# Patient Record
Sex: Male | Born: 1965
Health system: Southern US, Community
[De-identification: ages and names within clinical notes are randomized; demographics above are authoritative.]

## PROBLEM LIST (undated history)

## (undated) DIAGNOSIS — E119 Type 2 diabetes mellitus without complications: Secondary | ICD-10-CM

## (undated) DIAGNOSIS — L409 Psoriasis, unspecified: Secondary | ICD-10-CM

## (undated) DIAGNOSIS — J301 Allergic rhinitis due to pollen: Secondary | ICD-10-CM

## (undated) DIAGNOSIS — Z8619 Personal history of other infectious and parasitic diseases: Secondary | ICD-10-CM

## (undated) DIAGNOSIS — G4730 Sleep apnea, unspecified: Secondary | ICD-10-CM

## (undated) DIAGNOSIS — G510 Bell's palsy: Secondary | ICD-10-CM

## (undated) HISTORY — DX: Personal history of other infectious and parasitic diseases: Z86.19

## (undated) HISTORY — DX: Psoriasis, unspecified: L40.9

## (undated) HISTORY — PX: APPENDECTOMY: SHX54

## (undated) HISTORY — DX: Type 2 diabetes mellitus without complications: E11.9

## (undated) HISTORY — PX: ULNAR NERVE REPAIR: SHX2594

## (undated) HISTORY — DX: Bell's palsy: G51.0

## (undated) HISTORY — DX: Allergic rhinitis due to pollen: J30.1

## (undated) HISTORY — PX: SHOULDER SURGERY: SHX246

## (undated) HISTORY — DX: Sleep apnea, unspecified: G47.30

---

## 2008-02-12 DIAGNOSIS — G4730 Sleep apnea, unspecified: Secondary | ICD-10-CM

## 2008-02-12 HISTORY — DX: Sleep apnea, unspecified: G47.30

## 2008-09-27 ENCOUNTER — Ambulatory Visit: Payer: Self-pay | Admitting: Family Medicine

## 2009-02-17 ENCOUNTER — Other Ambulatory Visit: Payer: Self-pay | Admitting: Family Medicine

## 2010-03-26 ENCOUNTER — Ambulatory Visit: Payer: Self-pay | Admitting: Family Medicine

## 2010-04-02 ENCOUNTER — Ambulatory Visit: Payer: Self-pay | Admitting: Family Medicine

## 2013-03-08 ENCOUNTER — Ambulatory Visit (INDEPENDENT_AMBULATORY_CARE_PROVIDER_SITE_OTHER): Payer: BC Managed Care – PPO | Admitting: Adult Health

## 2013-03-08 ENCOUNTER — Encounter: Payer: Self-pay | Admitting: Adult Health

## 2013-03-08 VITALS — BP 110/70 | HR 76 | Temp 98.4°F | Resp 14 | Ht 71.0 in | Wt 241.8 lb

## 2013-03-08 DIAGNOSIS — Z23 Encounter for immunization: Secondary | ICD-10-CM

## 2013-03-08 DIAGNOSIS — R519 Headache, unspecified: Secondary | ICD-10-CM | POA: Insufficient documentation

## 2013-03-08 DIAGNOSIS — Z0000 Encounter for general adult medical examination without abnormal findings: Secondary | ICD-10-CM

## 2013-03-08 DIAGNOSIS — Z79899 Other long term (current) drug therapy: Secondary | ICD-10-CM

## 2013-03-08 DIAGNOSIS — Z125 Encounter for screening for malignant neoplasm of prostate: Secondary | ICD-10-CM

## 2013-03-08 DIAGNOSIS — E119 Type 2 diabetes mellitus without complications: Secondary | ICD-10-CM

## 2013-03-08 DIAGNOSIS — R51 Headache: Secondary | ICD-10-CM

## 2013-03-08 MED ORDER — SITAGLIPTIN PHOS-METFORMIN HCL 50-1000 MG PO TABS: 1.0000 | ORAL_TABLET | Freq: Two times a day (BID) | ORAL | Status: DC

## 2013-03-08 NOTE — Progress Notes (Signed)
Pre-visit discussion using our clinic review tool. No additional management support is needed unless otherwise documented below in the visit note.  

## 2013-03-08 NOTE — Patient Instructions (Signed)
   [facility] you for choosing [place].  Please have your labs drawn at your earliest convenience. Nothing to eat or drink after midnight except for water.  The results will be available through MyChart for your convenience. Please remember to activate this. The activation code is located at the end of this form.  If your headaches become severe or debilitating please call the office. I will send you for a scan.  You can try Excedrin Migraine or Tension Headache and alternate with the Ibuprofen.  You received your flu shot in clinic today.

## 2013-03-08 NOTE — Assessment & Plan Note (Signed)
Normal exam. Ordered labs: cbc w/diff, tsh, lipids, hepatic panel, bmet. Refill meds as needed.

## 2013-03-08 NOTE — Assessment & Plan Note (Signed)
Increasing frequency for about 6 months. Reports position change at work with added stress. Usually headaches resolve on their own or with ibuprofen. No other symptoms associated with his HA. Recommend relaxation, exercise. May try over-the-counter Excedrin tension headache. If symptoms worsen or fail to improve consider head CT.

## 2013-03-08 NOTE — Progress Notes (Signed)
Subjective:    Patient ID: Kenneth [name], male    DOB: June 04, 1965, 48 y.o.   MRN: [id number]  HPI  Pt is a pleasant 48 y/o male who presents to clinic to establish care.  He was previously [facility] by [facility]. Has been experience headaches for ~ 6 months in the center of the top of his head. They usually last 4-5 hours. He describes the pain as sharp. Denies photophobia, blurred vision, slurred speech, dizziness or any other symptom associated with HA. The headaches usually occur mid morning. Does not wake up with HA. Takes 400 - 600 mg of advil and may not have relief. Sometimes he does not take anything and the HA goes away on its own. Reports increasing stress and tension. 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   Past Medical History  Diagnosis Date  . History of chicken pox   . History of shingles   . Hay fever   . Diabetes   . Psoriasis   . Bell palsy   . Sleep apnea 2010    CPAP     Past Surgical History  Procedure Laterality Date  . Appendectomy    . Shoulder surgery Right   . Ulnar nerve repair Left      Family History  Problem Relation Age of Onset  . Cancer Mother [id number]    Pancreatic Cancer  . Hypertension Mother   . Arthritis Father   . Cancer Maternal Grandmother     Lung Cancer - Smoker  . Cancer Maternal Grandfather     Colon Cancer  . Heart disease Paternal Grandmother     Heart Failure  . Heart disease Paternal Grandfather     Heart Failure  . Diabetes Brother   . Diabetes Brother      History   Social History  . Marital Status: Married    Spouse Name: [name]    Number of Children: 1  . Years of Education: 418   Occupational History  . Aquatic Instructor     [employer]   Social History Main Topics  . Smoking status: Never Smoker   . Smokeless tobacco: Never Used  . Alcohol Use: Yes     Comment: occassionally  . Drug Use: No  . Sexual Activity: Not on file   Other  Topics Concern  . Not on file   Social History Narrative   Kenneth [name] grew up in [place], [place]. He attended [employer] and obtained his Bachelors degree in Health & Advertising account plannerHuman Performance. He then attended [employer] and obtained his Masters in [employer]. He is [place] living in the [place] area with his wife and daughter. He enjoys golf on his spare time as well as spending time with his family.      Caffeine - 6 cans of unsweetened colas or tea   No tobacco use   Exercise - none   Diet - Meats and Potatoes, some fruit, no vegetables    Health Maintenance:  Tdap - 2010  Flu shot - today  Pneumonia vaccine - 2010  Labs - needs today  Depression Screen - some mild symptoms. Well controlled on zoloft  Tobacco Use - None  Dental Exams - twice yearly  Vision Exam - needs appt. [facility]  Exercise - None  Diet - Meat and Potatoes. No vegetables, some fruit   Review of Systems  Constitutional: Negative.   HENT: Negative.  Eyes: Negative.   Respiratory: Negative.   Cardiovascular: Negative.   Gastrointestinal: Negative.   Endocrine: Negative.   Genitourinary: Negative.   Musculoskeletal: Negative.   Skin: Negative.   Allergic/Immunologic: Negative.   Neurological: Negative.   Hematological: Negative.   Psychiatric/Behavioral: Negative.        Objective:   Physical Exam  Constitutional: He is oriented to person, place, and time. He appears well-developed and well-nourished. No distress.  HENT:  Head: Normocephalic and atraumatic.  Right Ear: External ear normal.  Left Ear: External ear normal.  Nose: Nose normal.  Mouth/Throat: Oropharynx is clear and moist.  Eyes: Conjunctivae and EOM are normal. Pupils are equal, round, and reactive to light.  Neck: Normal range of motion. Neck supple. No tracheal deviation present. No thyromegaly present.  Cardiovascular: Normal rate, regular rhythm, normal heart sounds and intact distal pulses.  Exam  reveals no gallop and no friction rub.   No murmur heard. Pulmonary/Chest: Effort normal and breath sounds normal. No respiratory distress. He has no wheezes. He has no rales.  Abdominal: Soft. Bowel sounds are normal. He exhibits no distension and no mass. There is no tenderness. There is no rebound and no guarding.  Musculoskeletal: Normal range of motion. He exhibits no edema and no tenderness.  Lymphadenopathy:    He has no cervical adenopathy.  Neurological: He is alert and oriented to person, place, and time. He has normal reflexes. No cranial nerve deficit. Coordination normal.  Skin: Skin is warm and dry.  Psychiatric: He has a normal mood and affect. His behavior is normal. Judgment and thought content normal.    BP 110/70  Pulse 76  Temp(Src) 98.4 F (36.9 C) (Oral)  Resp 14  Ht 5\' 11"  (1.803 m)  Wt 241 lb 12 oz (109.657 kg)  BMI 33.73 kg/m2  SpO2 98%       Assessment & Plan:

## 2013-03-11 ENCOUNTER — Other Ambulatory Visit: Payer: Self-pay | Admitting: Adult Health

## 2013-03-11 ENCOUNTER — Encounter: Payer: Self-pay | Admitting: Adult Health

## 2013-03-11 ENCOUNTER — Other Ambulatory Visit (INDEPENDENT_AMBULATORY_CARE_PROVIDER_SITE_OTHER): Payer: BC Managed Care – PPO

## 2013-03-11 ENCOUNTER — Telehealth: Payer: Self-pay

## 2013-03-11 DIAGNOSIS — Z79899 Other long term (current) drug therapy: Secondary | ICD-10-CM

## 2013-03-11 DIAGNOSIS — Z125 Encounter for screening for malignant neoplasm of prostate: Secondary | ICD-10-CM

## 2013-03-11 DIAGNOSIS — E119 Type 2 diabetes mellitus without complications: Secondary | ICD-10-CM

## 2013-03-11 DIAGNOSIS — Z0000 Encounter for general adult medical examination without abnormal findings: Secondary | ICD-10-CM

## 2013-03-11 LAB — HEMOGLOBIN A1C: Hgb A1c MFr Bld: 8.6 % — ABNORMAL HIGH (ref 4.6–6.5)

## 2013-03-11 LAB — CBC WITH DIFFERENTIAL/PLATELET
Basophils Absolute: 0 10*3/uL (ref 0.0–0.1)
Basophils Relative: 0.4 % (ref 0.0–3.0)
Eosinophils Absolute: 0.1 10*3/uL (ref 0.0–0.7)
Eosinophils Relative: 1.6 % (ref 0.0–5.0)
HCT: 46.8 % (ref 39.0–52.0)
Hemoglobin: 15.5 g/dL (ref 13.0–17.0)
Lymphocytes Relative: 17.3 % (ref 12.0–46.0)
Lymphs Abs: 1.5 10*3/uL (ref 0.7–4.0)
MCHC: 33.1 g/dL (ref 30.0–36.0)
MCV: 89.5 fl (ref 78.0–100.0)
Monocytes Absolute: 0.6 10*3/uL (ref 0.1–1.0)
Monocytes Relative: 6.9 % (ref 3.0–12.0)
Neutro Abs: 6.4 10*3/uL (ref 1.4–7.7)
Neutrophils Relative %: 73.8 % (ref 43.0–77.0)
Platelets: 216 10*3/uL (ref 150.0–400.0)
RBC: 5.23 Mil/uL (ref 4.22–5.81)
RDW: 13.6 % (ref 11.5–14.6)
WBC: 8.7 10*3/uL (ref 4.5–10.5)

## 2013-03-11 LAB — LIPID PANEL
Cholesterol: 144 mg/dL (ref 0–200)
HDL: 43.1 mg/dL (ref >39.00)
LDL Cholesterol: 75 mg/dL (ref 0–99)
Total CHOL/HDL Ratio: 3
Triglycerides: 129 mg/dL (ref 0.0–149.0)
VLDL: 25.8 mg/dL (ref 0.0–40.0)

## 2013-03-11 LAB — HEPATIC FUNCTION PANEL
ALT: 41 U/L (ref 0–53)
AST: 25 U/L (ref 0–37)
Albumin: 4.1 g/dL (ref 3.5–5.2)
Alkaline Phosphatase: 69 U/L (ref 39–117)
Bilirubin, Direct: 0.1 mg/dL (ref 0.0–0.3)
Total Bilirubin: 0.9 mg/dL (ref 0.3–1.2)
Total Protein: 7.1 g/dL (ref 6.0–8.3)

## 2013-03-11 LAB — BASIC METABOLIC PANEL
BUN: 17 mg/dL (ref 6–23)
CO2: 24 mEq/L (ref 19–32)
Calcium: 9.5 mg/dL (ref 8.4–10.5)
Chloride: 108 mEq/L (ref 96–112)
Creatinine, Ser: 1 mg/dL (ref 0.4–1.5)
GFR: 85.47 mL/min (ref >60.00)
Glucose, Bld: 156 mg/dL — ABNORMAL HIGH (ref 70–99)
Potassium: 4.2 mEq/L (ref 3.5–5.1)
Sodium: 138 mEq/L (ref 135–145)

## 2013-03-11 LAB — TSH: TSH: 1.16 u[IU]/mL (ref 0.35–5.50)

## 2013-03-11 LAB — PSA: PSA: 0.93 ng/mL (ref 0.10–4.00)

## 2013-03-11 MED ORDER — INSULIN DETEMIR 100 UNIT/ML FLEXPEN: 10.0000 [IU] | PEN_INJECTOR | Freq: Every day | SUBCUTANEOUS | Status: DC

## 2013-03-11 NOTE — Telephone Encounter (Signed)
Relevant patient education assigned to patient using Emmi. ° °

## 2013-03-18 ENCOUNTER — Encounter: Payer: Self-pay | Admitting: Adult Health

## 2013-03-31 ENCOUNTER — Encounter: Payer: Self-pay | Admitting: Adult Health

## 2013-03-31 MED ORDER — CANAGLIFLOZIN 300 MG PO TABS: 300.0000 mg | ORAL_TABLET | Freq: Every day | ORAL | Status: DC

## 2013-06-14 ENCOUNTER — Emergency Department: Payer: Self-pay | Admitting: Emergency Medicine

## 2013-06-22 ENCOUNTER — Encounter: Payer: Self-pay | Admitting: Adult Health

## 2013-06-22 ENCOUNTER — Ambulatory Visit: Payer: BC Managed Care – PPO | Admitting: Adult Health

## 2013-06-22 ENCOUNTER — Ambulatory Visit (INDEPENDENT_AMBULATORY_CARE_PROVIDER_SITE_OTHER): Payer: Worker's Compensation | Admitting: Adult Health

## 2013-06-22 VITALS — BP 124/84 | HR 76 | Temp 98.1°F | Resp 14 | Wt 241.0 lb

## 2013-06-22 DIAGNOSIS — Z4802 Encounter for removal of sutures: Secondary | ICD-10-CM

## 2013-06-22 NOTE — Progress Notes (Signed)
Pre visit review using our clinic review tool, if applicable. No additional management support is needed unless otherwise documented below in the visit note. 
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
## 2013-11-11 ENCOUNTER — Other Ambulatory Visit: Payer: Self-pay | Admitting: *Deleted

## 2013-11-11 MED ORDER — SITAGLIPTIN PHOS-METFORMIN HCL 50-1000 MG PO TABS: 1.0000 | ORAL_TABLET | Freq: Two times a day (BID) | ORAL | Status: DC

## 2013-11-11 MED ORDER — CANAGLIFLOZIN 300 MG PO TABS: 300.0000 mg | ORAL_TABLET | Freq: Every day | ORAL | Status: DC

## 2013-12-22 ENCOUNTER — Ambulatory Visit (INDEPENDENT_AMBULATORY_CARE_PROVIDER_SITE_OTHER): Payer: BC Managed Care – PPO | Admitting: Internal Medicine

## 2013-12-22 ENCOUNTER — Encounter: Payer: Self-pay | Admitting: Internal Medicine

## 2013-12-22 ENCOUNTER — Ambulatory Visit (INDEPENDENT_AMBULATORY_CARE_PROVIDER_SITE_OTHER): Payer: BC Managed Care – PPO | Admitting: *Deleted

## 2013-12-22 VITALS — BP 130/94 | HR 77 | Temp 98.4°F | Resp 16 | Ht 71.0 in | Wt 242.2 lb

## 2013-12-22 DIAGNOSIS — E1165 Type 2 diabetes mellitus with hyperglycemia: Secondary | ICD-10-CM

## 2013-12-22 DIAGNOSIS — E669 Obesity, unspecified: Secondary | ICD-10-CM

## 2013-12-22 DIAGNOSIS — I10 Essential (primary) hypertension: Secondary | ICD-10-CM

## 2013-12-22 DIAGNOSIS — R519 Headache, unspecified: Secondary | ICD-10-CM

## 2013-12-22 DIAGNOSIS — Z23 Encounter for immunization: Secondary | ICD-10-CM

## 2013-12-22 DIAGNOSIS — G8929 Other chronic pain: Secondary | ICD-10-CM

## 2013-12-22 DIAGNOSIS — IMO0001 Reserved for inherently not codable concepts without codable children: Secondary | ICD-10-CM

## 2013-12-22 DIAGNOSIS — R51 Headache: Secondary | ICD-10-CM

## 2013-12-22 DIAGNOSIS — G4733 Obstructive sleep apnea (adult) (pediatric): Secondary | ICD-10-CM

## 2013-12-22 DIAGNOSIS — G4452 New daily persistent headache (NDPH): Secondary | ICD-10-CM

## 2013-12-22 DIAGNOSIS — Z9989 Dependence on other enabling machines and devices: Secondary | ICD-10-CM

## 2013-12-22 DIAGNOSIS — IMO0002 Reserved for concepts with insufficient information to code with codable children: Secondary | ICD-10-CM

## 2013-12-22 LAB — LIPID PANEL
Cholesterol: 163 mg/dL (ref 0–200)
HDL: 49.5 mg/dL (ref >39.00)
LDL Cholesterol: 99 mg/dL (ref 0–99)
NonHDL: 113.5
Total CHOL/HDL Ratio: 3
Triglycerides: 74 mg/dL (ref 0.0–149.0)
VLDL: 14.8 mg/dL (ref 0.0–40.0)

## 2013-12-22 LAB — COMPREHENSIVE METABOLIC PANEL
ALT: 60 U/L — ABNORMAL HIGH (ref 0–53)
AST: 34 U/L (ref 0–37)
Albumin: 3.9 g/dL (ref 3.5–5.2)
Alkaline Phosphatase: 69 U/L (ref 39–117)
BUN: 11 mg/dL (ref 6–23)
CO2: 20 mEq/L (ref 19–32)
Calcium: 9.8 mg/dL (ref 8.4–10.5)
Chloride: 110 mEq/L (ref 96–112)
Creatinine, Ser: 1 mg/dL (ref 0.4–1.5)
GFR: 81.39 mL/min (ref >60.00)
Glucose, Bld: 219 mg/dL — ABNORMAL HIGH (ref 70–99)
Potassium: 4.7 mEq/L (ref 3.5–5.1)
Sodium: 144 mEq/L (ref 135–145)
Total Bilirubin: 0.7 mg/dL (ref 0.2–1.2)
Total Protein: 7.4 g/dL (ref 6.0–8.3)

## 2013-12-22 LAB — MICROALBUMIN / CREATININE URINE RATIO
Creatinine,U: 116.8 mg/dL
Microalb Creat Ratio: 5.2 mg/g (ref 0.0–30.0)
Microalb, Ur: 6.1 mg/dL — ABNORMAL HIGH (ref 0.0–1.9)

## 2013-12-22 LAB — HEMOGLOBIN A1C: Hgb A1c MFr Bld: 9 % — ABNORMAL HIGH (ref 4.6–6.5)

## 2013-12-22 LAB — HM DIABETES FOOT EXAM: HM Diabetic Foot Exam: NORMAL

## 2013-12-22 MED ORDER — ALPRAZOLAM 0.25 MG PO TABS: 0.2500 mg | ORAL_TABLET | Freq: Two times a day (BID) | ORAL | Status: DC | PRN

## 2013-12-22 MED ORDER — SERTRALINE HCL 100 MG PO TABS: 100.0000 mg | ORAL_TABLET | Freq: Every day | ORAL | Status: DC

## 2013-12-22 MED ORDER — CANAGLIFLOZIN 300 MG PO TABS: 300.0000 mg | ORAL_TABLET | Freq: Every day | ORAL | Status: DC

## 2013-12-22 MED ORDER — LISINOPRIL 10 MG PO TABS: 10.0000 mg | ORAL_TABLET | Freq: Every day | ORAL | Status: DC

## 2013-12-22 MED ORDER — GLUCOSE BLOOD VI STRP
ORAL_STRIP | Status: DC
Start: 2013-12-22 — End: 2016-03-15

## 2013-12-22 MED ORDER — SITAGLIPTIN PHOS-METFORMIN HCL 50-1000 MG PO TABS: 1.0000 | ORAL_TABLET | Freq: Two times a day (BID) | ORAL | Status: DC

## 2013-12-22 NOTE — Progress Notes (Signed)
Patient ID: Kenneth [name], male   DOB: [birth year], 48 y.o.   MRN: [id number]  Patient Active Problem List   Diagnosis Date Noted  . Diabetes mellitus type 2, uncontrolled, without complications 12/24/2013  . Obesity 12/24/2013  . OSA on CPAP 12/24/2013  . Essential hypertension 12/24/2013  . Routine general medical examination at a health care facility 03/08/2013  . Headache 03/08/2013    Subjective:  CC:   Chief Complaint  Patient presents with  . Follow-up    Type II diaetes, Patient forgets to take levemir for 2 weeks at a time and then will remember for two weeks.  . Diabetes    HPI:   Kenneth [name] is a 48 y.o. male who presents for   1) Follow upon uncontrolled DM type 2.  He was diagnosed 10 years ago,  Last time it was controlled was 2 years ago with janumet.  Not exercising,  Not following a low glycemic index diet, not checking his blood sugars but taking his medications as directed.     2) 1.5 yrs of daily headache ,  Top of head ,  Takes advil twice weekly, sometimes stabbing, that's when he takes the advil . Marland Kitchen.  n progression Wakes up with it.    Prior evaluation has included Sleep study ,  And he has been using CPAP for  the past 6 years. wearing it a minimum of 6 hours per night ,  Has been feeling less rested  For the past several months .  Rates the headache as 2/10 on any given day,  5 to 6/10 on the worst day   History of cervical DDD ,  prior myelogram done when living in [place] in 1987 showed a C5 problem  With right arm radiculopathy which was  confirmed with EMG nerve conduction studies  No history of concussion .  Non smoker    Past Medical History  Diagnosis Date  . History of chicken pox   . History of shingles   . Hay fever   . Diabetes   . Psoriasis   . Bell palsy   . Sleep apnea 2010    CPAP    Past Surgical History  Procedure Laterality Date  . Appendectomy    . Shoulder surgery Right   . Ulnar nerve repair Left        The  following portions of the patient's history were reviewed and updated as appropriate: Allergies, current medications, and problem list.    Review of Systems:   Patient denies headache, fevers, malaise, unintentional weight loss, skin rash, eye pain, sinus congestion and sinus pain, sore throat, dysphagia,  hemoptysis , cough, dyspnea, wheezing, chest pain, palpitations, orthopnea, edema, abdominal pain, nausea, melena, diarrhea, constipation, flank pain, dysuria, hematuria, urinary  Frequency, nocturia, numbness, tingling, seizures,  Focal weakness, Loss of consciousness,  Tremor, insomnia, depression, anxiety, and suicidal ideation.     History   Social History  . Marital Status: Married    Spouse Name: [name]    Number of Children: 1  . Years of Education: 2318   Occupational History  . Aquatic Instructor     [employer]   Social History Main Topics  . Smoking status: Never Smoker   . Smokeless tobacco: Never Used  . Alcohol Use: Yes     Comment: occassionally  . Drug Use: No  . Sexual Activity: Not on file   Other Topics Concern  . Not on file   Social History Narrative  Con grew up in [place], [place]. He attended [employer] and obtained his Bachelors degree in Health & Advertising account planner. He then attended [employer] and obtained his Masters in [employer]. He is currently living in the [place] area with his wife and daughter. He enjoys golf on his spare time as well as spending time with his family.      Caffeine - 6 cans of unsweetened colas or tea   No tobacco use   Exercise - none   Diet - Meats and Potatoes, some fruit, no vegetables    Objective:  Filed Vitals:   12/22/13 0851  BP: 130/94  Pulse: 77  Temp: 98.4 F (36.9 C)  Resp: 16     General appearance: alert, cooperative and appears stated age Ears: normal TM's and external ear canals both ears Throat: lips, mucosa, and tongue normal; teeth and gums normal Neck: no adenopathy, no carotid  bruit, supple, symmetrical, trachea midline and thyroid not enlarged, symmetric, no tenderness/mass/nodules Back: symmetric, no curvature. ROM normal. No CVA tenderness. Lungs: clear to auscultation bilaterally Heart: regular rate and rhythm, S1, S2 normal, no murmur, click, rub or gallop Abdomen: soft, non-tender; bowel sounds normal; no masses,  no organomegaly Pulses: 2+ and symmetric Skin: Skin color, texture, turgor normal. No rashes or lesions Lymph nodes: Cervical, supraclavicular, and axillary nodes normal. Neuro: CNs 2-12 intact. DTRs 2+/4 in biceps, brachioradialis, patellars and achilles. Muscle strength 5/5 in upper and lower exremities. Fine resting tremor bilaterally both hands cerebellar function normal. Romberg negative.  No pronator drift.   Gait normal.    Assessment and Plan:  Diabetes mellitus type 2, uncontrolled, without complications  diabetes is under poor control on current regimen,  So we are  Changing levemir to NPH starting at 15 units in the evening. Will adjust 3 x 3..foot exam is normal and eye exam reminder given with referral made  Headache He has a chronic daily headache for the past 1.5 years with treated OSA,  Cervical DDD and no habitual use of NSAIDs.  MRI of brain ordered to rule out mass  Obesity I have addressed  BMI and recommended wt loss of 10% of body weigh over the next 6 months using a low glycemic index diet and regular exercise a minimum of 5 days per week.    OSA on CPAP Diagnosed by sleep study. he is wearing her CPAP every night a minimum of 6 hours per night and notes improved daytime wakefulness and decreased fatigue   Essential hypertension Elevated today.  Reviewed list of meds, patient is not taking OTC meds that could be causing,. It.  Have asked patient to recheck bp at home a minimum of 5 times over the next 4 weeks and call readings to office for adjustment of medications.    A total of 40 minutes was spent with patient more  than half of which was spent in counseling patient on the above mentioned issues , reviewing and explaining recent labs and imaging studies done, and coordination of care.  Updated Medication List Outpatient Encounter Prescriptions as of 12/22/2013  Medication Sig  . canagliflozin (INVOKANA) 300 MG TABS tablet Take 300 mg by mouth daily.  Marland Kitchen lisinopril (PRINIVIL,ZESTRIL) 10 MG tablet Take 1 tablet (10 mg total) by mouth daily.  . sertraline (ZOLOFT) 100 MG tablet Take 1 tablet (100 mg total) by mouth daily.  . simvastatin (ZOCOR) 20 MG tablet Take 20 mg by mouth daily.  . sitaGLIPtin-metformin (JANUMET) 50-1000 MG per tablet  Take 1 tablet by mouth 2 (two) times daily with a meal.  . [DISCONTINUED] Canagliflozin (INVOKANA) 300 MG TABS Take 1 tablet (300 mg total) by mouth daily.  . [DISCONTINUED] Insulin Detemir (LEVEMIR) 100 UNIT/ML Pen Inject 10 Units into the skin daily at 10 pm.  . [DISCONTINUED] lisinopril (PRINIVIL,ZESTRIL) 10 MG tablet Take 10 mg by mouth daily.  . [DISCONTINUED] sertraline (ZOLOFT) 100 MG tablet Take 100 mg by mouth daily.  . [DISCONTINUED] sitaGLIPtin-metformin (JANUMET) 50-1000 MG per tablet Take 1 tablet by mouth 2 (two) times daily with a meal.  . ALPRAZolam (XANAX) 0.25 MG tablet Take 1 tablet (0.25 mg total) by mouth 2 (two) times daily as needed for anxiety.  Marland Kitchen. glucose blood test strip For One touch Meter  Use to check blood sugars twice daily  . insulin NPH Human (NOVOLIN N RELION) 100 UNIT/ML injection Inject 0.15 mLs (15 Units total) into the skin at bedtime.     Orders Placed This Encounter  Procedures  . MR Brain Wo Contrast  . Pneumococcal conjugate vaccine 13-valent  . Hemoglobin A1c  . Lipid panel  . Microalbumin / creatinine urine ratio  . Comprehensive metabolic panel  . Ambulatory referral to Ophthalmology  . HM DIABETES FOOT EXAM    Return in about 3 months (around 03/24/2014) for follow up diabetes.

## 2013-12-22 NOTE — Patient Instructions (Addendum)
1) FOR YOUR PERSISTENT DAILY HEADACHE"   MRI BRAIN   (You can use the alprazolam just before the procedure to help you relax,  Start with 1 tablet )  2) FOR YOUR DIABETES:   Continue the Invokanna and the Janumet  Depending on the fasting blood sugars and the A1c, I may change the Levemir to NPH insulin to help target the fasting hyperglycemia faster   Referral for eye exam

## 2013-12-22 NOTE — Progress Notes (Signed)
Pre-visit discussion using our clinic review tool. No additional management support is needed unless otherwise documented below in the visit note.  

## 2013-12-24 ENCOUNTER — Other Ambulatory Visit: Payer: Self-pay | Admitting: Internal Medicine

## 2013-12-24 ENCOUNTER — Encounter: Payer: Self-pay | Admitting: Internal Medicine

## 2013-12-24 DIAGNOSIS — G4733 Obstructive sleep apnea (adult) (pediatric): Secondary | ICD-10-CM | POA: Insufficient documentation

## 2013-12-24 DIAGNOSIS — Z794 Long term (current) use of insulin: Secondary | ICD-10-CM

## 2013-12-24 DIAGNOSIS — E1165 Type 2 diabetes mellitus with hyperglycemia: Secondary | ICD-10-CM

## 2013-12-24 DIAGNOSIS — IMO0002 Reserved for concepts with insufficient information to code with codable children: Secondary | ICD-10-CM | POA: Insufficient documentation

## 2013-12-24 DIAGNOSIS — E669 Obesity, unspecified: Secondary | ICD-10-CM | POA: Insufficient documentation

## 2013-12-24 DIAGNOSIS — E1121 Type 2 diabetes mellitus with diabetic nephropathy: Secondary | ICD-10-CM | POA: Insufficient documentation

## 2013-12-24 DIAGNOSIS — Z9989 Dependence on other enabling machines and devices: Secondary | ICD-10-CM

## 2013-12-24 DIAGNOSIS — I10 Essential (primary) hypertension: Secondary | ICD-10-CM | POA: Insufficient documentation

## 2013-12-24 MED ORDER — "INSULIN SYRINGE/NEEDLE 28G X 1/2"" 1 ML MISC": 1.0000 | Freq: Every day | Status: DC

## 2013-12-24 MED ORDER — INSULIN NPH (HUMAN) (ISOPHANE) 100 UNIT/ML ~~LOC~~ SUSP: 15.0000 [IU] | Freq: Every day | SUBCUTANEOUS | Status: DC

## 2013-12-24 NOTE — Assessment & Plan Note (Addendum)
diabetes is under poor control on current regimen,  So we are  Changing levemir to NPH starting at 15 units in the evening. Will adjust 3 x 3..foot exam is normal and eye exam reminder given with referral made

## 2013-12-24 NOTE — Assessment & Plan Note (Signed)
Elevated today.  Reviewed list of meds, patient is not taking OTC meds that could be causing,. It.  Have asked patient to recheck bp at home a minimum of 5 times over the next 4 weeks and call readings to office for adjustment of medications.   

## 2013-12-24 NOTE — Assessment & Plan Note (Signed)
I have addressed  BMI and recommended wt loss of 10% of body weigh over the next 6 months using a low glycemic index diet and regular exercise a minimum of 5 days per week.   

## 2013-12-24 NOTE — Assessment & Plan Note (Signed)
Diagnosed by sleep study. he is wearing her CPAP every night a minimum of 6 hours per night and notes improved daytime wakefulness and decreased fatigue  

## 2013-12-24 NOTE — Assessment & Plan Note (Addendum)
He has a chronic daily headache for the past 1.5 years with treated OSA,  Cervical DDD and no habitual use of NSAIDs.  MRI of brain ordered to rule out mass

## 2013-12-27 ENCOUNTER — Other Ambulatory Visit: Payer: Self-pay | Admitting: *Deleted

## 2013-12-27 MED ORDER — "INSULIN SYRINGE 29G X 1/2"" 1 ML MISC": Status: DC

## 2013-12-27 NOTE — Telephone Encounter (Signed)
Fax from pharmacy, needing new Rx for insulin syringe, Relion brand. Rx sent to pharmacy by escript

## 2013-12-30 LAB — HM DIABETES EYE EXAM

## 2014-01-10 ENCOUNTER — Ambulatory Visit: Payer: Self-pay | Admitting: Internal Medicine

## 2014-01-11 ENCOUNTER — Encounter: Payer: Self-pay | Admitting: *Deleted

## 2014-01-12 ENCOUNTER — Telehealth: Payer: Self-pay | Admitting: Internal Medicine

## 2014-01-12 DIAGNOSIS — K118 Other diseases of salivary glands: Secondary | ICD-10-CM

## 2014-01-12 DIAGNOSIS — G44209 Tension-type headache, unspecified, not intractable: Secondary | ICD-10-CM

## 2014-01-19 ENCOUNTER — Encounter: Payer: Self-pay | Admitting: Adult Health

## 2014-01-20 ENCOUNTER — Encounter: Payer: Self-pay | Admitting: Internal Medicine

## 2014-01-21 ENCOUNTER — Encounter: Payer: Self-pay | Admitting: Nurse Practitioner

## 2014-01-21 ENCOUNTER — Ambulatory Visit (INDEPENDENT_AMBULATORY_CARE_PROVIDER_SITE_OTHER): Payer: BC Managed Care – PPO | Admitting: Nurse Practitioner

## 2014-01-21 VITALS — BP 122/83 | HR 73 | Temp 98.0°F | Resp 12 | Ht 71.0 in | Wt 237.8 lb

## 2014-01-21 DIAGNOSIS — IMO0001 Reserved for inherently not codable concepts without codable children: Secondary | ICD-10-CM

## 2014-01-21 DIAGNOSIS — E1165 Type 2 diabetes mellitus with hyperglycemia: Secondary | ICD-10-CM

## 2014-01-21 NOTE — Progress Notes (Signed)
Pre visit review using our clinic review tool, if applicable. No additional management support is needed unless otherwise documented below in the visit note. 

## 2014-01-21 NOTE — Progress Notes (Signed)
Subjective:    Patient ID: Kenneth [name], male    DOB: 02-Feb-1966, 48 y.o.   MRN: [id number]  HPI Kenneth [name] is a 48 yo male here to follow up after being seen for diabetes management, switched to NPH insulin. Last OV 12/24/13 with Dr. Darrick Huntsmanullo.   1) DM Type II-  NPH 15 units in evening. Switched from [employer]vemir. Just started this week.  120 this am fasting.  Diet- Working on Limiting carbs Exercise- Optometristwim coach. No formal.  No hypoglycemic events noted.  No complaints with NPH, getting used to medication. He is hopeful with the 120 BS this am.   Summary of BS- Highest 182, median 146, mean 142.9, no lows  Seeing ENT visit after this visit for parotid mass found on MRI.    Review of Systems  Constitutional: Negative for fever, chills, diaphoresis, fatigue and unexpected weight change.  Eyes: Negative for visual disturbance.  Gastrointestinal: Negative for nausea, vomiting and diarrhea.  Endocrine: Negative for polydipsia, polyphagia and polyuria.  Genitourinary: Negative for dysuria.  Skin: Negative for rash.  Neurological: Positive for headaches. Negative for tremors, syncope, weakness and numbness.       Headache on top of head (chronic issue being addressed)  Psychiatric/Behavioral: The patient is not nervous/anxious.    Past Medical History  Diagnosis Date  . History of chicken pox   . History of shingles   . Hay fever   . Diabetes   . Psoriasis   . Bell palsy   . Sleep apnea 2010    CPAP    History   Social History  . Marital Status: Married    Spouse Name: [name]    Number of Children: 1  . Years of Education: 7918   Occupational History  . Aquatic Instructor     [employer]   Social History Main Topics  . Smoking status: Never Smoker   . Smokeless tobacco: Never Used  . Alcohol Use: Yes     Comment: occassionally  . Drug Use: No  . Sexual Activity: Not on file   Other Topics Concern  . Not on file   Social History Narrative   Kenneth [name] [place], [place]. He attended [employer] and obtained his Bachelors degree in Health & Advertising account plannerHuman Performance. He then attended [employer] and obtained his Masters in [employer]. He is currently living in the [place] area with his wife and daughter. He enjoys golf on his spare time as well as spending time with his family.      Caffeine - 6 cans of unsweetened colas or tea   No tobacco use   Exercise - none   Diet - Meats and Potatoes, some fruit, no vegetables    Past Surgical History  Procedure Laterality Date  . Appendectomy    . Shoulder surgery Right   . Ulnar nerve repair Left     Family History  Problem Relation Age of Onset  . Cancer Mother [id number]    Pancreatic Cancer  . Hypertension Mother   . Arthritis Father   . Cancer Maternal Grandmother     Lung Cancer - Smoker  . Cancer Maternal Grandfather     Colon Cancer  . Heart disease Paternal Grandmother     Heart Failure  . Heart disease Paternal Grandfather     Heart Failure  . Diabetes Brother   . Diabetes Brother     Allergies  Allergen Reactions  . Aleve [Naproxen Sodium]   . Naproxen  Rash    Current Outpatient Prescriptions on File Prior to Visit  Medication Sig Dispense Refill  . canagliflozin (INVOKANA) 300 MG TABS tablet Take 300 mg by mouth daily. 30 tablet 5  . glucose blood test strip For One touch Meter  Use to check blood sugars twice daily 100 each 12  . insulin NPH Human (NOVOLIN N RELION) 100 UNIT/ML injection Inject 0.15 mLs (15 Units total) into the skin at bedtime. 10 mL 11  . INSULIN SYRINGE 1CC/29G (RELION INSULIN SYRINGE) 29G X 1/2" 1 ML MISC Use as directed 100 each 0  . lisinopril (PRINIVIL,ZESTRIL) 10 MG tablet Take 1 tablet (10 mg total) by mouth daily. 90 tablet 1  . sertraline (ZOLOFT) 100 MG tablet Take 1 tablet (100 mg total) by mouth daily. 90 tablet 1  . simvastatin (ZOCOR) 20 MG tablet Take 20 mg by mouth daily.    . sitaGLIPtin-metformin (JANUMET) 50-1000 MG per tablet  Take 1 tablet by mouth 2 (two) times daily with a meal. 180 tablet 2   No current facility-administered medications on file prior to visit.       Objective:   Physical Exam  Constitutional: He is oriented to person, place, and time. He appears well-developed and well-nourished. No distress.  HENT:  Head: Normocephalic and atraumatic.  Cardiovascular: Normal rate and regular rhythm.   Pulmonary/Chest: Effort normal.  Neurological: He is alert and oriented to person, place, and time.  Skin: Skin is warm and dry. He is not diaphoretic.  Psychiatric: He has a normal mood and affect. His behavior is normal. Judgment and thought content normal.    BP 122/83 mmHg  Pulse 73  Temp(Src) 98 F (36.7 C)  Resp 12  Ht 5\' 11"  (1.803 m)  Wt 237 lb 12.8 oz (107.865 kg)  BMI 33.18 kg/m2  SpO2 96%     Assessment & Plan:

## 2014-01-21 NOTE — Assessment & Plan Note (Signed)
Diabetes is still not well controlled. Pt only started x 1 week. Will follow up in Feb for A1c and additional testing. Doing with with 15 units of NPH in the evening. Instructed continuing low carb diet, adding in 30 min exercise goal of 5 days a week.

## 2014-01-21 NOTE — Patient Instructions (Signed)
Continue with healthy low carb diet.   Fit in at least 30 minutes of exercise 5 x week (goal). This will help with weight and blood sugar.   If you have questions or concerns, please call.  See you in Feb.   Happy Holidays!

## 2014-02-10 ENCOUNTER — Encounter: Payer: Self-pay | Admitting: Nurse Practitioner

## 2014-02-10 MED ORDER — SIMVASTATIN 20 MG PO TABS: 20.0000 mg | ORAL_TABLET | Freq: Every day | ORAL | Status: DC

## 2014-02-28 ENCOUNTER — Ambulatory Visit: Payer: Self-pay | Admitting: Unknown Physician Specialty

## 2014-02-28 LAB — BASIC METABOLIC PANEL
Anion Gap: 10 (ref 7–16)
BUN: 15 mg/dL (ref 7–18)
Calcium, Total: 9.1 mg/dL (ref 8.5–10.1)
Chloride: 104 mmol/L (ref 98–107)
Co2: 26 mmol/L (ref 21–32)
Creatinine: 1.24 mg/dL (ref 0.60–1.30)
EGFR (African American): >60
EGFR (Non-African Amer.): >60
Glucose: 216 mg/dL — ABNORMAL HIGH (ref 65–99)
Osmolality: 287 (ref 275–301)
Potassium: 3.9 mmol/L (ref 3.5–5.1)
Sodium: 140 mmol/L (ref 136–145)

## 2014-03-02 ENCOUNTER — Ambulatory Visit: Payer: Self-pay | Admitting: Otolaryngology

## 2014-03-24 ENCOUNTER — Encounter: Payer: Self-pay | Admitting: Nurse Practitioner

## 2014-03-24 ENCOUNTER — Ambulatory Visit (INDEPENDENT_AMBULATORY_CARE_PROVIDER_SITE_OTHER): Payer: BLUE CROSS/BLUE SHIELD | Admitting: Nurse Practitioner

## 2014-03-24 VITALS — BP 128/82 | HR 87 | Temp 97.6°F | Resp 12 | Ht 71.0 in | Wt 244.4 lb

## 2014-03-24 DIAGNOSIS — IMO0001 Reserved for inherently not codable concepts without codable children: Secondary | ICD-10-CM

## 2014-03-24 DIAGNOSIS — E1165 Type 2 diabetes mellitus with hyperglycemia: Secondary | ICD-10-CM

## 2014-03-24 DIAGNOSIS — IMO0002 Reserved for concepts with insufficient information to code with codable children: Secondary | ICD-10-CM

## 2014-03-24 LAB — HEMOGLOBIN A1C: Hgb A1c MFr Bld: 8.2 % — ABNORMAL HIGH (ref 4.6–6.5)

## 2014-03-24 NOTE — Patient Instructions (Addendum)
Follow up in 3 months for diabetes check up.   Diabetes and Exercise Exercising regularly is important. It is not just about losing weight. It has many health benefits, such as:  Improving your overall fitness, flexibility, and endurance.  Increasing your bone density.  Helping with weight control.  Decreasing your body fat.  Increasing your muscle strength.  Reducing stress and tension.  Improving your overall health. People with diabetes who exercise gain additional benefits because exercise:  Reduces appetite.  Improves the body's use of blood sugar (glucose).  Helps lower or control blood glucose.  Decreases blood pressure.  Helps control blood lipids (such as cholesterol and triglycerides).  Improves the body's use of the hormone insulin by:  Increasing the body's insulin sensitivity.  Reducing the body's insulin needs.  Decreases the risk for heart disease because exercising:  Lowers cholesterol and triglycerides levels.  Increases the levels of good cholesterol (such as high-density lipoproteins [HDL]) in the body.  Lowers blood glucose levels. YOUR ACTIVITY PLAN  Choose an activity that you enjoy and set realistic goals. Your health care provider or diabetes educator can help you make an activity plan that works for you. Exercise regularly as directed by your health care provider. This includes:  Performing resistance training twice a week such as push-ups, sit-ups, lifting weights, or using resistance bands.  Performing 150 minutes of cardio exercises each week such as walking, running, or playing sports.  Staying active and spending no more than 90 minutes at one time being inactive. Even short bursts of exercise are good for you. Three 10-minute sessions spread throughout the day are just as beneficial as a single 30-minute session. Some exercise ideas include:  Taking the dog for a walk.  Taking the stairs instead of the elevator.  Dancing to your  favorite song.  Doing an exercise video.  Doing your favorite exercise with a friend. RECOMMENDATIONS FOR EXERCISING WITH TYPE 1 OR TYPE 2 DIABETES   Check your blood glucose before exercising. If blood glucose levels are greater than 240 mg/dL, check for urine ketones. Do not exercise if ketones are present.  Avoid injecting insulin into areas of the body that are going to be exercised. For example, avoid injecting insulin into:  The arms when playing tennis.  The legs when jogging.  Keep a record of:  Food intake before and after you exercise.  Expected peak times of insulin action.  Blood glucose levels before and after you exercise.  The type and amount of exercise you have done.  Review your records with your health care provider. Your health care provider will help you to develop guidelines for adjusting food intake and insulin amounts before and after exercising.  If you take insulin or oral hypoglycemic agents, watch for signs and symptoms of hypoglycemia. They include:  Dizziness.  Shaking.  Sweating.  Chills.  Confusion.  Drink plenty of water while you exercise to prevent dehydration or heat stroke. Body water is lost during exercise and must be replaced.  Talk to your health care provider before starting an exercise program to make sure it is safe for you. Remember, almost any type of activity is better than none. Document Released: 04/20/2003 Document Revised: 06/14/2013 Document Reviewed: 07/07/2012 [facility] Patient Information 2015 [place] [place]. This information is not intended to replace advice given to you by your health care provider. Make sure you discuss any questions you have with your health care provider.

## 2014-03-24 NOTE — Progress Notes (Signed)
Subjective:    Patient ID: Kenneth [name], male    DOB: 31-Jul-1965, 49 y.o.   MRN: [id number]  HPI  Kenneth [name] is a 49 yo male here for a FU of diabetes type II.   1) Diabetes Type 2 diabetes  NPH 15 units in evening Increases by 3 units when sugars are up  21 units last night Levemir- Stopped  Janument and Invokana- no complaints BS at home: 140's to 150's  Diet: Trying to limit carbs Exercise: daily work  Hypoglycemic events- None reported  Denies any non-healing wounds Foot exam- due in Nov.  Eye exam- Due later this year  03/02/14 had the parotid mass removed- benign.   Review of Systems  Constitutional: Negative for fever, chills, diaphoresis and fatigue.  Eyes: Negative for visual disturbance.  Respiratory: Negative for chest tightness, shortness of breath and wheezing.   Cardiovascular: Negative for chest pain, palpitations and leg swelling.  Gastrointestinal: Negative for nausea, vomiting and diarrhea.  Endocrine: Positive for polydipsia, polyphagia and polyuria.  Genitourinary: Negative for dysuria.  Skin: Negative for rash.       Removal of right side   Neurological: Positive for headaches.       Same headache that has been going on   Past Medical History  Diagnosis Date  . History of chicken pox   . History of shingles   . Hay fever   . Diabetes   . Psoriasis   . Bell palsy   . Sleep apnea 2010    CPAP    History   Social History  . Marital Status: Married    Spouse Name: [name]  . Number of Children: 1  . Years of Education: 2618   Occupational History  . Aquatic Instructor     [employer]   Social History Main Topics  . Smoking status: Never Smoker   . Smokeless tobacco: Never Used  . Alcohol Use: Yes     Comment: occassionally  . Drug Use: No  . Sexual Activity: Not on file   Other Topics Concern  . Not on file   Social History Narrative   Kenneth [name] He attended [employer] and obtained his Bachelors degree in  Health & Advertising account plannerHuman Performance. He then attended [employer] and obtained his Masters in [employer]. [place] living in the [place] with his wife and daughter. He enjoys golf on his spare time as well as spending time with his family.      Caffeine - 6 cans of unsweetened colas or tea   No tobacco use   Exercise - none   Diet - Meats and Potatoes, some fruit, no vegetables    Past Surgical History  Procedure Laterality Date  . Appendectomy    . Shoulder surgery Right   . Ulnar nerve repair Left     Family History  Problem Relation Age of Onset  . Cancer Mother [id number]    Pancreatic Cancer  . Hypertension Mother   . Arthritis Father   . Cancer Maternal Grandmother     Lung Cancer - Smoker  . Cancer Maternal Grandfather     Colon Cancer  . Heart disease Paternal Grandmother     Heart Failure  . Heart disease Paternal Grandfather     Heart Failure  . Diabetes Brother   . Diabetes Brother     Allergies  Allergen Reactions  . Aleve [Naproxen Sodium]   . Naproxen Rash    Current  Outpatient Prescriptions on File Prior to Visit  Medication Sig Dispense Refill  . canagliflozin (INVOKANA) 300 MG TABS tablet Take 300 mg by mouth daily. 30 tablet 5  . glucose blood test strip For One touch Meter  Use to check blood sugars twice daily 100 each 12  . insulin NPH Human (NOVOLIN N RELION) 100 UNIT/ML injection Inject 0.15 mLs (15 Units total) into the skin at bedtime. 10 mL 11  . INSULIN SYRINGE 1CC/29G (RELION INSULIN SYRINGE) 29G X 1/2" 1 ML MISC Use as directed 100 each 0  . lisinopril (PRINIVIL,ZESTRIL) 10 MG tablet Take 1 tablet (10 mg total) by mouth daily. 90 tablet 1  . sertraline (ZOLOFT) 100 MG tablet Take 1 tablet (100 mg total) by mouth daily. 90 tablet 1  . simvastatin (ZOCOR) 20 MG tablet Take 1 tablet (20 mg total) by mouth daily. 30 tablet 5  . sitaGLIPtin-metformin (JANUMET) 50-1000 MG per tablet Take 1 tablet by mouth 2 (two) times daily with a meal.  180 tablet 2   No current facility-administered medications on file prior to visit.      Objective:   Physical Exam  Constitutional: He is oriented to person, place, and time. He appears well-developed and well-nourished. No distress.  BP 128/82 mmHg  Pulse 87  Temp(Src) 97.6 F (36.4 C) (Oral)  Resp 12  Ht  (1.803 m)  Wt 244 lb 6.4 oz (110.859 kg)  BMI 34.10 kg/m2  SpO2 97%   HENT:  Head: Normocephalic and atraumatic.  Right Ear: External ear normal.  Left Ear: External ear normal.  Eyes: EOM are normal. Pupils are equal, round, and reactive to light. Right eye exhibits no discharge. Left eye exhibits no discharge. No scleral icterus.  Neck: Normal range of motion. Neck supple.  Cardiovascular: Normal rate, regular rhythm, normal heart sounds and intact distal pulses.  Exam reveals no gallop and no friction rub.   No murmur heard. Pulmonary/Chest: Effort normal and breath sounds normal. No respiratory distress. He has no wheezes. He has no rales. He exhibits no tenderness.  Abdominal:  Obese   Neurological: He is alert and oriented to person, place, and time.  Skin: Skin is warm and dry. No rash noted. He is not diaphoretic.  Psychiatric: He has a normal mood and affect. His behavior is normal. Judgment and thought content normal.       Assessment & Plan:

## 2014-03-24 NOTE — Assessment & Plan Note (Signed)
No changes since last visit. BS running higher because of surgery last month. Obtain A1c and Microablumin today. FU 3 months.

## 2014-03-24 NOTE — Progress Notes (Signed)
Pre visit review using our clinic review tool, if applicable. No additional management support is needed unless otherwise documented below in the visit note. 

## 2014-03-25 LAB — MICROALBUMIN, URINE: Microalb, Ur: 2.3 mg/dL — ABNORMAL HIGH (ref <2.0)

## 2014-06-06 LAB — SURGICAL PATHOLOGY

## 2014-06-12 NOTE — Op Note (Signed)
PATIENT NAME:  Kenneth [name], Kenneth [name] MR#:  [id number] DATE OF BIRTH:  10/04/1965  DATE OF PROCEDURE:  03/02/2014   PREOPERATIVE DIAGNOSIS: Left parotid mass.   POSTOPERATIVE DIAGNOSIS: Left parotid mass.   PROCEDURE:  Left superficial parotidectomy with facial nerve dissection and facial nerve monitoring.   SURGEON:  Marion DownerScott Josie Burleigh, MD  FIRST ASSISTANT:  Vernie MurdersPaul Juengel, MD.   ANESTHESIA: General endotracheal.   INDICATIONS: The patient with a history of a benign-appearing left parotid mass involving the tail of the parotid.   FINDINGS: The patient had a 1.3 cm soft tissue mass within the tail of the left parotid gland.   COMPLICATIONS: None.   DESCRIPTION OF PROCEDURE: After obtaining informed consent, the patient was taken to the Operating Room and placed in the supine position. After induction of general endotracheal anesthesia, the patient was turned 90 degrees. The facial nerve monitor electrodes were placed in the mid face and lower lip in the usual fashion and proper functioning confirmed. The skin was injected over the proposed incision site with 1% lidocaine with epinephrine 1:200,000.  He was then prepped and draped in the usual sterile fashion. A 15 blade was used to incise the skin and an S-shaped incision from just in front of the ear down below the earlobe and curving into the neck. The incision was carried down to the subcutaneous fatty tissue and inferiorly, dissection proceeded down to the anterior border of the sternocleidomastoid, initially dividing tissue attachments with the Bovie and later switching to the Harmonic scalpel. A flap was then elevated over the parotid fascia with scissors and down over the SMAS. Dissection then proceeded be on the external aspect of the tragal cartilage dissecting the parotid tissue away from the tragal cartilage and the mastoid. Facial nerve monitoring was used during the entire procedure. A sequential dissection was performed dividing soft tissue  attachments with the Harmonic scalpel until the facial nerve was encountered. This was confirmed with the nerve stimulator. The nerve was then dissected out along its length, out to the PES. The mass was palpable and visualized during the dissection, taking care not to violate the capsule of the mass. By the time we had dissected out to the PES and divided intervening soft tissue and attachments the portion of the parotid gland containing the mass was well dissected out and free of the nerve at this point. The gland was then resected anterior to the mass and sent as a specimen. The residual parotid capsule was tacked back to the sternocleidomastoid muscle inferiorly to close the dead space. A #10 TLS drain was placed through stab incision in the skin and secured with a 5-0 Prolene suture. The wound was inspected for any residual bleeding and minor bleeding controlled with the bipolar cautery. The wound was then closed in layers with 4-0 Vicryl suture for the deep closure and 5-0  Prolene in a running locked stitch for the skin. Small segment of skin redundancy was resected in the inferior aspect of the incision in the upper neck to facilitate more cosmetic closure. With the wound closed the drain was hooked to suction and the patient was returned to the anesthesiologist for awakening. He was awakened and taken to the recovery room in good condition postoperatively.   ESTIMATED BLOOD LOSS:  Less than 25 mL.  ____________________________ Ollen GrossPaul S. Willeen CassBennett, MD psb:at D: 03/02/2014 09:56:05 ET T: 03/02/2014 10:26:26 ET JOB#: [id number]  cc: Ollen GrossPaul S. Willeen CassBennett, MD, <Dictator> Sandi MealyPAUL S Maymunah Stegemann MD ELECTRONICALLY SIGNED 03/06/2014 23:00

## 2014-06-22 ENCOUNTER — Encounter: Payer: Self-pay | Admitting: Nurse Practitioner

## 2014-06-22 ENCOUNTER — Ambulatory Visit (INDEPENDENT_AMBULATORY_CARE_PROVIDER_SITE_OTHER): Payer: BLUE CROSS/BLUE SHIELD | Admitting: Nurse Practitioner

## 2014-06-22 VITALS — BP 108/72 | HR 93 | Resp 14 | Ht 71.0 in | Wt 239.2 lb

## 2014-06-22 DIAGNOSIS — E1165 Type 2 diabetes mellitus with hyperglycemia: Secondary | ICD-10-CM

## 2014-06-22 DIAGNOSIS — IMO0001 Reserved for inherently not codable concepts without codable children: Secondary | ICD-10-CM

## 2014-06-22 LAB — HEMOGLOBIN A1C: Hgb A1c MFr Bld: 8.2 % — ABNORMAL HIGH (ref 4.6–6.5)

## 2014-06-22 MED ORDER — LISINOPRIL 10 MG PO TABS: 10.0000 mg | ORAL_TABLET | Freq: Every day | ORAL | Status: DC

## 2014-06-22 NOTE — Patient Instructions (Signed)
Follow up in November.   Let us know if you need anything in the mean time.

## 2014-06-22 NOTE — Progress Notes (Signed)
Pre visit review using our clinic review tool, if applicable. No additional management support is needed unless otherwise documented below in the visit note. 

## 2014-06-22 NOTE — Progress Notes (Signed)
   Subjective:    Patient ID: Kenneth [name], male    DOB: [birth year], 49 y.o.   MRN: [id number]  HPI  Kenneth [name] is a 49 yo male with a 3 month follow up for DM type II.   1) Janumet and Invokana daily  Novolin N 18-21 units at bedtime  ASA- No aspirin therapy   110-140 fasting in the morning   No hypoglycemic events 2) HTN- needs lisinopril refilled, BP goes up and down according to stress and dehydration he reports.   Review of Systems  Constitutional: Negative for fever, chills, diaphoresis and fatigue.  Eyes: Negative for visual disturbance.  Respiratory: Negative for chest tightness, shortness of breath and wheezing.   Cardiovascular: Negative for chest pain, palpitations and leg swelling.  Gastrointestinal: Negative for nausea, vomiting and diarrhea.  Endocrine: Negative for polydipsia, polyphagia and polyuria.  Skin: Negative for rash.  Neurological: Negative for dizziness, weakness and numbness.  Psychiatric/Behavioral: The patient is not nervous/anxious.        Objective:   Physical Exam  Constitutional: He is oriented to person, place, and time. He appears well-developed and well-nourished. No distress.  BP 108/72 mmHg  Pulse 93  Resp 14  Ht 5\' 11"  (1.803 m)  Wt 239 lb 4 oz (108.523 kg)  BMI 33.38 kg/m2  SpO2 95%   HENT:  Head: Normocephalic and atraumatic.  Right Ear: External ear normal.  Left Ear: External ear normal.  Cardiovascular: Normal rate, regular rhythm, normal heart sounds and intact distal pulses.  Exam reveals no gallop and no friction rub.   No murmur heard. Pulmonary/Chest: Effort normal and breath sounds normal. No respiratory distress. He has no wheezes. He has no rales. He exhibits no tenderness.  Neurological: He is alert and oriented to person, place, and time.  Skin: Skin is warm and dry. No rash noted. He is not diaphoretic.  Psychiatric: He has a normal mood and affect. His behavior is normal. Judgment and thought content normal.        Assessment & Plan:

## 2014-07-02 NOTE — Assessment & Plan Note (Signed)
Continue regimen until results return. Obtain A1c and discussed possible food choices to work on.

## 2014-08-15 ENCOUNTER — Other Ambulatory Visit: Payer: Self-pay | Admitting: Internal Medicine

## 2014-08-15 ENCOUNTER — Other Ambulatory Visit: Payer: Self-pay | Admitting: Nurse Practitioner

## 2014-11-07 ENCOUNTER — Other Ambulatory Visit: Payer: Self-pay | Admitting: Nurse Practitioner

## 2014-12-11 ENCOUNTER — Other Ambulatory Visit: Payer: Self-pay | Admitting: Internal Medicine

## 2014-12-23 ENCOUNTER — Encounter: Payer: Self-pay | Admitting: Nurse Practitioner

## 2014-12-23 ENCOUNTER — Ambulatory Visit (INDEPENDENT_AMBULATORY_CARE_PROVIDER_SITE_OTHER): Payer: BLUE CROSS/BLUE SHIELD | Admitting: Nurse Practitioner

## 2014-12-23 VITALS — BP 109/70 | HR 79 | Temp 98.2°F | Ht 71.0 in | Wt 242.1 lb

## 2014-12-23 DIAGNOSIS — E1165 Type 2 diabetes mellitus with hyperglycemia: Secondary | ICD-10-CM | POA: Diagnosis not present

## 2014-12-23 DIAGNOSIS — I10 Essential (primary) hypertension: Secondary | ICD-10-CM

## 2014-12-23 DIAGNOSIS — Z23 Encounter for immunization: Secondary | ICD-10-CM | POA: Diagnosis not present

## 2014-12-23 DIAGNOSIS — IMO0001 Reserved for inherently not codable concepts without codable children: Secondary | ICD-10-CM

## 2014-12-23 LAB — LIPID PANEL
Cholesterol: 150 mg/dL (ref 0–200)
HDL: 50.4 mg/dL (ref >39.00)
LDL Cholesterol: 85 mg/dL (ref 0–99)
NonHDL: 100.04
Total CHOL/HDL Ratio: 3
Triglycerides: 74 mg/dL (ref 0.0–149.0)
VLDL: 14.8 mg/dL (ref 0.0–40.0)

## 2014-12-23 LAB — HEMOGLOBIN A1C: Hgb A1c MFr Bld: 8 % — ABNORMAL HIGH (ref 4.6–6.5)

## 2014-12-23 MED ORDER — AMITRIPTYLINE HCL 10 MG PO TABS: 10.0000 mg | ORAL_TABLET | Freq: Every day | ORAL | Status: DC

## 2014-12-23 NOTE — Patient Instructions (Addendum)
Thank you for getting your flu vaccine!   We will follow up in 6 or sooner as needed.   Healthy diet and exercise!   Please visit the lab before leaving today.  Send me a message in a few weeks if the medication is helpful or not.  Take at bedtime!

## 2014-12-23 NOTE — Progress Notes (Signed)
Pre visit review using our clinic review tool, if applicable. No additional management support is needed unless otherwise documented below in the visit note. 

## 2014-12-23 NOTE — Progress Notes (Signed)
Patient ID: Kenneth [name], male    DOB: 21-May-1965  Age: 49 y.o. MRN: [id number]  CC: Follow-up   HPI Kenneth SpeckingDavid [name] presents for DM follow up.   1) Wants flu vaccine today Needs foot exam and A1c today Exercise- No change since last visit  Diet- No changes   History Kenneth [name] has a past medical history of History of chicken pox; History of shingles; Hay fever; Diabetes (HCC); Psoriasis; Bell palsy; and Sleep apnea (2010).   He has past surgical history that includes Appendectomy; Shoulder surgery (Right); and Ulnar nerve repair (Left).   His family history includes Arthritis in his father; Cancer in his maternal grandfather and maternal grandmother; Cancer (age of onset: 2074) in his mother; Diabetes in his brother and brother; Heart disease in his paternal grandfather and paternal grandmother; Hypertension in his mother.He reports that he has never smoked. He has never used smokeless tobacco. He reports that he drinks alcohol. He reports that he does not use illicit drugs.  Outpatient Prescriptions Prior to Visit  Medication Sig Dispense Refill  . canagliflozin (INVOKANA) 300 MG TABS tablet Take 300 mg by mouth daily. 30 tablet 5  . glucose blood test strip For One touch Meter  Use to check blood sugars twice daily 100 each 12  . insulin NPH Human (NOVOLIN N RELION) 100 UNIT/ML injection Inject 0.15 mLs (15 Units total) into the skin at bedtime. 10 mL 11  . INSULIN SYRINGE 1CC/29G (RELION INSULIN SYRINGE) 29G X 1/2" 1 ML MISC Use as directed 100 each 0  . INVOKANA 300 MG TABS tablet TAKE ONE TABLET BY MOUTH ONCE DAILY 30 tablet 0  . JANUMET 50-1000 MG tablet TAKE ONE TABLET BY MOUTH TWICE DAILY WITH  A  MEAL 60 tablet 0  . lisinopril (PRINIVIL,ZESTRIL) 10 MG tablet Take 1 tablet (10 mg total) by mouth daily. 90 tablet 2  . sertraline (ZOLOFT) 100 MG tablet TAKE ONE TABLET BY MOUTH ONCE DAILY 90 tablet 0  . simvastatin (ZOCOR) 20 MG tablet TAKE ONE TABLET BY MOUTH ONCE DAILY 30 tablet 5  .  sitaGLIPtin-metformin (JANUMET) 50-1000 MG per tablet Take 1 tablet by mouth 2 (two) times daily with a meal. 180 tablet 2   No facility-administered medications prior to visit.    ROS Review of Systems  Constitutional: Negative for fever, chills, diaphoresis and fatigue.  Eyes: Negative for visual disturbance.  Respiratory: Negative for chest tightness, shortness of breath and wheezing.   Cardiovascular: Negative for chest pain, palpitations and leg swelling.  Gastrointestinal: Negative for nausea, vomiting and diarrhea.  Endocrine: Negative for polydipsia, polyphagia and polyuria.  Skin: Negative for rash.  Neurological: Negative for dizziness, weakness and numbness.  Psychiatric/Behavioral: The patient is not nervous/anxious.     Objective:  BP 109/70 mmHg  Pulse 79  Temp(Src) 98.2 F (36.8 C) (Oral)  Ht 5\' 11"  (1.803 m)  Wt 242 lb 2 oz (109.827 kg)  BMI 33.78 kg/m2  SpO2 95%  Physical Exam  Constitutional: He is oriented to person, place, and time. He appears well-developed and well-nourished. No distress.  HENT:  Head: Normocephalic and atraumatic.  Right Ear: External ear normal.  Left Ear: External ear normal.  Cardiovascular: Normal rate, regular rhythm and normal heart sounds.  Exam reveals no gallop and no friction rub.   No murmur heard. Pulmonary/Chest: Effort normal and breath sounds normal. No respiratory distress. He has no wheezes. He has no rales. He exhibits no tenderness.  Abdominal:  Obese  Neurological: He is  alert and oriented to person, place, and time.  Skin: Skin is warm and dry. No rash noted. He is not diaphoretic.  Psychiatric: He has a normal mood and affect. His behavior is normal. Judgment and thought content normal.   Assessment & Plan:   Ryleigh was seen today for follow-up.  Diagnoses and all orders for this visit:  Essential hypertension -     Lipid Profile -     HgB A1c  Uncontrolled type 2 diabetes mellitus without complication,  without long-term current use of insulin (HCC) -     Lipid Profile -     HgB A1c  Encounter for immunization  Other orders -     amitriptyline (ELAVIL) 10 MG tablet; Take 1 tablet (10 mg total) by mouth at bedtime. -     Flu Vaccine QUAD 36+ mos IM -     Cancel: Flu Vaccine QUAD 36+ mos IM   I am having Mr. Julin start on amitriptyline. I am also having him maintain his sitaGLIPtin-metformin, canagliflozin, glucose blood, insulin NPH Human, INSULIN SYRINGE 1CC/29G, lisinopril, simvastatin, sertraline, JANUMET, and INVOKANA.  Meds ordered this encounter  Medications  . amitriptyline (ELAVIL) 10 MG tablet    Sig: Take 1 tablet (10 mg total) by mouth at bedtime.    Dispense:  30 tablet    Refill:  0    Order Specific Question:  Supervising Provider    Answer:  Sherlene Shams [[id number]]     Follow-up: Return in about 6 months (around 06/22/2015).

## 2014-12-29 NOTE — Assessment & Plan Note (Signed)
A1c obtained today. Continue current regimen. Encouraged healthy diet and exercise.

## 2014-12-29 NOTE — Assessment & Plan Note (Signed)
BP Readings from Last 3 Encounters:  12/23/14 109/70  06/22/14 108/72  03/24/14 128/82   Stable. Continue regimen. Obtain lipid panel today

## 2015-01-10 ENCOUNTER — Encounter: Payer: Self-pay | Admitting: Nurse Practitioner

## 2015-01-15 ENCOUNTER — Other Ambulatory Visit: Payer: Self-pay | Admitting: Nurse Practitioner

## 2015-02-28 ENCOUNTER — Other Ambulatory Visit: Payer: Self-pay

## 2015-02-28 MED ORDER — LISINOPRIL 10 MG PO TABS: 10.0000 mg | ORAL_TABLET | Freq: Every day | ORAL | Status: DC

## 2015-02-28 MED ORDER — SERTRALINE HCL 100 MG PO TABS: 100.0000 mg | ORAL_TABLET | Freq: Every day | ORAL | Status: DC

## 2015-03-24 ENCOUNTER — Other Ambulatory Visit: Payer: Self-pay | Admitting: Nurse Practitioner

## 2015-03-24 NOTE — Telephone Encounter (Signed)
Junmet, Elavil refilled

## 2015-03-31 ENCOUNTER — Other Ambulatory Visit: Payer: Self-pay | Admitting: Nurse Practitioner

## 2015-05-24 ENCOUNTER — Other Ambulatory Visit: Payer: Self-pay | Admitting: Nurse Practitioner

## 2015-05-27 ENCOUNTER — Other Ambulatory Visit: Payer: Self-pay | Admitting: Nurse Practitioner

## 2015-08-09 ENCOUNTER — Encounter: Payer: Self-pay | Admitting: Internal Medicine

## 2015-08-09 ENCOUNTER — Encounter: Payer: Self-pay | Admitting: Nurse Practitioner

## 2015-08-09 ENCOUNTER — Ambulatory Visit (INDEPENDENT_AMBULATORY_CARE_PROVIDER_SITE_OTHER): Payer: BLUE CROSS/BLUE SHIELD | Admitting: Internal Medicine

## 2015-08-09 VITALS — BP 110/70 | HR 102 | Temp 98.1°F | Resp 12 | Ht 71.0 in | Wt 236.0 lb

## 2015-08-09 DIAGNOSIS — E1165 Type 2 diabetes mellitus with hyperglycemia: Secondary | ICD-10-CM | POA: Diagnosis not present

## 2015-08-09 DIAGNOSIS — Z1159 Encounter for screening for other viral diseases: Secondary | ICD-10-CM

## 2015-08-09 DIAGNOSIS — Z79899 Other long term (current) drug therapy: Secondary | ICD-10-CM

## 2015-08-09 DIAGNOSIS — I10 Essential (primary) hypertension: Secondary | ICD-10-CM

## 2015-08-09 DIAGNOSIS — E785 Hyperlipidemia, unspecified: Secondary | ICD-10-CM | POA: Diagnosis not present

## 2015-08-09 DIAGNOSIS — E1121 Type 2 diabetes mellitus with diabetic nephropathy: Secondary | ICD-10-CM | POA: Diagnosis not present

## 2015-08-09 DIAGNOSIS — G629 Polyneuropathy, unspecified: Secondary | ICD-10-CM | POA: Diagnosis not present

## 2015-08-09 DIAGNOSIS — K760 Fatty (change of) liver, not elsewhere classified: Secondary | ICD-10-CM

## 2015-08-09 DIAGNOSIS — E669 Obesity, unspecified: Secondary | ICD-10-CM

## 2015-08-09 DIAGNOSIS — Z125 Encounter for screening for malignant neoplasm of prostate: Secondary | ICD-10-CM | POA: Diagnosis not present

## 2015-08-09 DIAGNOSIS — G44211 Episodic tension-type headache, intractable: Secondary | ICD-10-CM

## 2015-08-09 LAB — MICROALBUMIN / CREATININE URINE RATIO
Creatinine,U: 52.6 mg/dL
Microalb Creat Ratio: 3.6 mg/g (ref 0.0–30.0)
Microalb, Ur: 1.9 mg/dL (ref 0.0–1.9)

## 2015-08-09 MED ORDER — AMITRIPTYLINE HCL 25 MG PO TABS: 25.0000 mg | ORAL_TABLET | Freq: Every day | ORAL | Status: DC

## 2015-08-09 NOTE — Progress Notes (Signed)
Pre-visit discussion using our clinic review tool. No additional management support is needed unless otherwise documented below in the visit note.  

## 2015-08-09 NOTE — Patient Instructions (Signed)
  To make a low carb chip :  Take the Joseph's Lavash or Pita bread,  Or the [place] Low carb whole wheat tortilla   Place on metal cookie sheet  Brush with olive oil  Sprinkle garlic powder (NOT garlic salt), grated parmesan cheese, mediterranean seasoning , or all of them?  Bake at 275 for 30 minutes   We have substitutions for your potatoes!!  Try the mashed cauliflower and riced cauliflower dishes instead of rice and mashed potatoes  Mashed turnips are also very low carb!   For desserts :  Try the Dannon Lt n Fit greek yogurt dessert flavors and top with reddi Whip .  8 carbs,  80 calories  Try Oikos Triple Zero Greek Yogurt in the salted caramel, and the coffee flavors  With Whipped Cream for dessert  breyer's low carb ice cream, available in bars (on a stick, better ) or scoopable ice cream  HERE ARE THE LOW CARB  BREAD CHOICES        

## 2015-08-09 NOTE — Progress Notes (Signed)
Subjective:  Patient ID: Kenneth [name], male    DOB: [birth year]  Age: 50 y.o. MRN: [id number]  CC: The primary encounter diagnosis was Uncontrolled type 2 diabetes mellitus with diabetic nephropathy, unspecified long term insulin use status (HCC). Diagnoses of Neuropathy (HCC), Long-term use of high-risk medication, Prostate cancer screening, Need for hepatitis C screening test, Fatty liver, Hyperlipidemia, Essential hypertension, Intractable episodic tension-type headache, and Obesity were also pertinent to this visit.  HPI Kenneth SpeckingDavid Tully presents for follow up on uncontrolled DM and HTNlast a1c was over 6 months ago .  Not using insulin  Did not refill it due to feeling subjectively like he was having low blood sugars ,  Only Janumet .  Doesn't check sugars daily   Sugars 110 or 120 at bedtime ( 3 hours post prandial) Has reduced bread  Cut out sugared drinks but still drinks Gatorade. drinks lemonade rarely.  . No alcohol. 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 Fatty liver , ,[facility]  Last eye exam was Fall 2015 .  Cc:  Having daily headaches, managed with Elavil with initial improvement in frequency and severity .  For the past 5 months, more frequent 3-4/week and icepick in severity.  Different places.. Scalp feels tender when he gets them they last from a few hours to several day. Using advil averaging only 2 times per week 400 to 600 mg  Which help. .  Does not wake up with them. Mostly  Early  afternoon. No vision changes  No nausea or dizziness, no lacrimation or stuffy nose.  Has history of stiff neck ,  Lab Results  Component Value Date   HGBA1C 9.2* 08/09/2015     Outpatient Prescriptions Prior to Visit  Medication Sig Dispense Refill  . glucose blood test strip For One touch Meter  Use to check blood sugars twice daily 100 each 12  . INSULIN SYRINGE 1CC/29G (RELION INSULIN SYRINGE) 29G X 1/2" 1 ML MISC Use as directed 100 each 0  .  INVOKANA 300 MG TABS tablet TAKE 1 TABLET BY MOUTH EVERY DAY 30 tablet 11  . JANUMET 50-1000 MG tablet TAKE 1 TABLET BY MOUTH TWICE A DAY WITH MEALS 60 tablet 11  . lisinopril (PRINIVIL,ZESTRIL) 10 MG tablet Take 1 tablet (10 mg total) by mouth daily. 90 tablet 2  . sertraline (ZOLOFT) 100 MG tablet TAKE 1 TABLET (100 MG TOTAL) BY MOUTH DAILY. 90 tablet 1  . simvastatin (ZOCOR) 20 MG tablet TAKE 1 TABLET BY MOUTH EVERY DAY 30 tablet 2  . amitriptyline (ELAVIL) 10 MG tablet TAKE 1 TABLET BY MOUTH AT BEDTIME 30 tablet 11  . insulin NPH Human (NOVOLIN N RELION) 100 UNIT/ML injection Inject 0.15 mLs (15 Units total) into the skin at bedtime. (Patient not taking: Reported on 08/09/2015) 10 mL 11   No facility-administered medications prior to visit.    Review of Systems;  Patient denies headache, fevers, malaise, unintentional weight loss, skin rash, eye pain, sinus congestion and sinus pain, sore throat, dysphagia,  hemoptysis , cough, dyspnea, wheezing, chest pain, palpitations, orthopnea, edema, abdominal pain, nausea, melena, diarrhea, constipation, flank pain, dysuria, hematuria, urinary  Frequency, nocturia, numbness, tingling, seizures,  Focal weakness, Loss of consciousness,  Tremor, insomnia, depression, anxiety, and suicidal ideation.      Objective:  BP 110/70 mmHg  Pulse 102  Temp(Src) 98.1 F (36.7 C) (Oral)  Resp 12  Ht 5\' 11"  (1.803 m)  Wt 236  lb (107.049 kg)  BMI 32.93 kg/m2  SpO2 96%  BP Readings from Last 3 Encounters:  08/09/15 110/70  12/23/14 109/70  06/22/14 108/72    Wt Readings from Last 3 Encounters:  08/09/15 236 lb (107.049 kg)  12/23/14 242 lb 2 oz (109.827 kg)  06/22/14 239 lb 4 oz (108.523 kg)    General appearance: alert, cooperative and appears stated age Ears: normal TM's and external ear canals both ears Throat: lips, mucosa, and tongue normal; teeth and gums normal Neck: no adenopathy, no carotid bruit, supple, symmetrical, trachea midline and  thyroid not enlarged, symmetric, no tenderness/mass/nodules Back: symmetric, no curvature. ROM normal. No CVA tenderness. Lungs: clear to auscultation bilaterally Heart: regular rate and rhythm, S1, S2 normal, no murmur, click, rub or gallop Abdomen: soft, non-tender; bowel sounds normal; no masses,  no organomegaly Pulses: 2+ and symmetric Skin: Skin color, texture, turgor normal. No rashes or lesions Lymph nodes: Cervical, supraclavicular, and axillary nodes normal.  Lab Results  Component Value Date   HGBA1C 9.2* 08/09/2015   HGBA1C 8.0* 12/23/2014   HGBA1C 8.2* 06/22/2014    Lab Results  Component Value Date   CREATININE 1.22 08/09/2015   CREATININE 1.24 02/28/2014   CREATININE 1.0 12/22/2013    Lab Results  Component Value Date   WBC 8.7 03/11/2013   HGB 15.5 03/11/2013   HCT 46.8 03/11/2013   PLT 216.0 03/11/2013   GLUCOSE 233* 08/09/2015   CHOL 146 08/09/2015   TRIG 180.0* 08/09/2015   HDL 49.10 08/09/2015   LDLDIRECT 80.0 08/09/2015   LDLCALC 61 08/09/2015   ALT 73* 08/09/2015   AST 38* 08/09/2015   NA 136 08/09/2015   K 4.0 08/09/2015   CL 100 08/09/2015   CREATININE 1.22 08/09/2015   BUN 12 08/09/2015   CO2 27 08/09/2015   TSH 1.16 03/11/2013   PSA 0.67 08/09/2015   HGBA1C 9.2* 08/09/2015   MICROALBUR 1.9 08/09/2015    No results found.  Assessment & Plan:   Problem List Items Addressed This Visit    Headache    No warning signs, prior MRI normal.  Will increase elavil to 25 mg daily      Relevant Medications   amitriptyline (ELAVIL) 25 MG tablet   Obesity    I have addressed  BMI and recommended a low glycemic index diet utilizing smaller more frequent meals to increase metabolism.  I have also recommended that patient start exercising with a goal of 30 minutes of aerobic exercise a minimum of 5 days per week. Screening for lipid disorders, thyroid and diabetes to be done today.        Essential hypertension    Well controlled on current  regimen. Renal function stable, no changes today.  Lab Results  Component Value Date   CREATININE 1.22 08/09/2015   Lab Results  Component Value Date   NA 136 08/09/2015   K 4.0 08/09/2015   CL 100 08/09/2015   CO2 27 08/09/2015          Other Visit Diagnoses    Uncontrolled type 2 diabetes mellitus with diabetic nephropathy, unspecified long term insulin use status (HCC)    -  Primary    Relevant Orders    Ambulatory referral to Ophthalmology    Comprehensive metabolic panel (Completed)    Hemoglobin A1c (Completed)    Lipid panel (Completed)    Microalbumin / creatinine urine ratio (Completed)    Neuropathy (HCC)        Long-term use  of high-risk medication        Relevant Orders    Vitamin B12 (Completed)    Prostate cancer screening        Relevant Orders    PSA (Completed)    Need for hepatitis C screening test        Fatty liver        Relevant Orders    Hepatitis C antibody (Completed)    Hyperlipidemia        Relevant Orders    LDL cholesterol, direct (Completed)     A total of 25 minutes of face to face time was spent with patient more than half of which was spent in counselling about the above mentioned conditions  and coordination of care    I have changed Mr. Dacey's amitriptyline. I am also having him maintain his glucose blood, insulin NPH Human, INSULIN SYRINGE 1CC/29G, lisinopril, JANUMET, INVOKANA, simvastatin, and sertraline.  Meds ordered this encounter  Medications  . amitriptyline (ELAVIL) 25 MG tablet    Sig: Take 1 tablet (25 mg total) by mouth at bedtime.    Dispense:  90 tablet    Refill:  1    Medications Discontinued During This Encounter  Medication Reason  . amitriptyline (ELAVIL) 10 MG tablet Reorder    Follow-up: No Follow-up on file.   Sherlene ShamsULLO, Hayli Milligan L, MD

## 2015-08-10 LAB — LIPID PANEL
Cholesterol: 146 mg/dL (ref 0–200)
HDL: 49.1 mg/dL (ref >39.00)
LDL Cholesterol: 61 mg/dL (ref 0–99)
NonHDL: 96.55
Total CHOL/HDL Ratio: 3
Triglycerides: 180 mg/dL — ABNORMAL HIGH (ref 0.0–149.0)
VLDL: 36 mg/dL (ref 0.0–40.0)

## 2015-08-10 LAB — COMPREHENSIVE METABOLIC PANEL
ALT: 73 U/L — ABNORMAL HIGH (ref 0–53)
AST: 38 U/L — ABNORMAL HIGH (ref 0–37)
Albumin: 4.7 g/dL (ref 3.5–5.2)
Alkaline Phosphatase: 71 U/L (ref 39–117)
BUN: 12 mg/dL (ref 6–23)
CO2: 27 mEq/L (ref 19–32)
Calcium: 10.2 mg/dL (ref 8.4–10.5)
Chloride: 100 mEq/L (ref 96–112)
Creatinine, Ser: 1.22 mg/dL (ref 0.40–1.50)
GFR: 66.5 mL/min (ref >60.00)
Glucose, Bld: 233 mg/dL — ABNORMAL HIGH (ref 70–99)
Potassium: 4 mEq/L (ref 3.5–5.1)
Sodium: 136 mEq/L (ref 135–145)
Total Bilirubin: 0.7 mg/dL (ref 0.2–1.2)
Total Protein: 7.4 g/dL (ref 6.0–8.3)

## 2015-08-10 LAB — VITAMIN B12: Vitamin B-12: 205 pg/mL — ABNORMAL LOW (ref 211–911)

## 2015-08-10 LAB — HEPATITIS C ANTIBODY: HCV Ab: NEGATIVE

## 2015-08-10 LAB — PSA: PSA: 0.67 ng/mL (ref 0.10–4.00)

## 2015-08-10 LAB — HEMOGLOBIN A1C: Hgb A1c MFr Bld: 9.2 % — ABNORMAL HIGH (ref 4.6–6.5)

## 2015-08-10 LAB — LDL CHOLESTEROL, DIRECT: Direct LDL: 80 mg/dL

## 2015-08-12 NOTE — Assessment & Plan Note (Signed)
I have addressed  BMI and recommended a low glycemic index diet utilizing smaller more frequent meals to increase metabolism.  I have also recommended that patient start exercising with a goal of 30 minutes of aerobic exercise a minimum of 5 days per week. Screening for lipid disorders, thyroid and diabetes to be done today.   

## 2015-08-12 NOTE — Assessment & Plan Note (Signed)
Well controlled on current regimen. Renal function stable, no changes today.  Lab Results  Component Value Date   CREATININE 1.22 08/09/2015   Lab Results  Component Value Date   NA 136 08/09/2015   K 4.0 08/09/2015   CL 100 08/09/2015   CO2 27 08/09/2015

## 2015-08-12 NOTE — Assessment & Plan Note (Signed)
No warning signs, prior MRI normal.  Will increase elavil to 25 mg daily

## 2015-08-13 ENCOUNTER — Telehealth: Payer: Self-pay | Admitting: Internal Medicine

## 2015-08-13 ENCOUNTER — Encounter: Payer: Self-pay | Admitting: Internal Medicine

## 2015-08-13 NOTE — Telephone Encounter (Signed)
Sent via mychart,  Needs appts for B12:  Your B12 is low, You need to come in for b12 injections weekly for 3 weeks,  Followed by monthly injections.  This may be due to your metformin .  You can learn how to do them yourself at any of your visits if you feel comfortable .  Untreated B12 deficiency can lead to irreversible dementia and neuropathy so it is very important to treat   Your diabetes is under poor control on current regimen.  Your A1c is over 9.    Please make an office visit in 3 weeks to discuss change in therapy,   And bring a log of the last 3 weeks of blood sugars with you to your appt .  I am interested in fastings and 2 hour post prandials (after a meal) along with a brief description of the meal content related to the particular blood sugars. You should check it on the average 2 times  daily .

## 2015-08-16 NOTE — Telephone Encounter (Signed)
Please schedule for nurse visit for B 12 injection.X 3 weekly then monthly.

## 2015-08-16 NOTE — Telephone Encounter (Signed)
Left a message on pt's voice mail to call back and make a nurse visit to have a b12 injection.

## 2015-08-17 NOTE — Telephone Encounter (Signed)
Scheduled patient first of three weekly b 12 injections and sent my chart message with appointment tried to reach patient by phone message left with no return call.

## 2015-08-22 ENCOUNTER — Ambulatory Visit (INDEPENDENT_AMBULATORY_CARE_PROVIDER_SITE_OTHER): Payer: BLUE CROSS/BLUE SHIELD

## 2015-08-22 ENCOUNTER — Ambulatory Visit: Payer: Self-pay

## 2015-08-22 DIAGNOSIS — E538 Deficiency of other specified B group vitamins: Secondary | ICD-10-CM

## 2015-08-22 MED ORDER — CYANOCOBALAMIN 1000 MCG/ML IJ SOLN: 1000.0000 ug | Freq: Once | INTRAMUSCULAR | Status: DC

## 2015-08-22 MED ORDER — CYANOCOBALAMIN 1000 MCG/ML IJ SOLN
1000.0000 ug | Freq: Once | INTRAMUSCULAR | Status: AC
  Administered 2015-08-22: 1000 ug via INTRAMUSCULAR

## 2015-08-22 NOTE — Progress Notes (Addendum)
Patient ID: Kenneth [name], male   DOB: [birth year], 50 y.o.   MRN: [id number] Patient presented for 1 out of 3 B12 injections.  Administered L deltoid, tolerated well.  Verbalized understanding to have weekly injections for 3 weeks and follow with monthly injections, per Dr. Melina Schoolsullo's order.

## 2015-08-29 ENCOUNTER — Ambulatory Visit (INDEPENDENT_AMBULATORY_CARE_PROVIDER_SITE_OTHER): Payer: BLUE CROSS/BLUE SHIELD

## 2015-08-29 ENCOUNTER — Ambulatory Visit: Payer: Self-pay

## 2015-08-29 DIAGNOSIS — E538 Deficiency of other specified B group vitamins: Secondary | ICD-10-CM | POA: Diagnosis not present

## 2015-08-29 MED ORDER — CYANOCOBALAMIN 1000 MCG/ML IJ SOLN
1000.0000 ug | Freq: Once | INTRAMUSCULAR | Status: AC
  Administered 2015-08-29: 1000 ug via INTRAMUSCULAR

## 2015-08-29 NOTE — Progress Notes (Signed)
Patient in today receiving a B12 injection in the right deltoid. Patient tolerated well.

## 2015-08-31 ENCOUNTER — Other Ambulatory Visit: Payer: Self-pay | Admitting: Nurse Practitioner

## 2015-09-26 ENCOUNTER — Ambulatory Visit: Payer: Self-pay

## 2015-11-24 ENCOUNTER — Other Ambulatory Visit: Payer: Self-pay | Admitting: Nurse Practitioner

## 2015-12-14 ENCOUNTER — Ambulatory Visit: Payer: Self-pay | Admitting: Internal Medicine

## 2016-03-04 ENCOUNTER — Telehealth: Payer: Self-pay | Admitting: Internal Medicine

## 2016-03-04 NOTE — Telephone Encounter (Signed)
I called pt back regarding Mychart msg pt wants to sch a office visit. Thank you!

## 2016-03-15 ENCOUNTER — Ambulatory Visit (INDEPENDENT_AMBULATORY_CARE_PROVIDER_SITE_OTHER): Payer: BLUE CROSS/BLUE SHIELD | Admitting: Internal Medicine

## 2016-03-15 ENCOUNTER — Ambulatory Visit: Payer: Self-pay | Admitting: Internal Medicine

## 2016-03-15 VITALS — BP 120/86 | HR 86 | Temp 98.4°F | Resp 16 | Wt 232.0 lb

## 2016-03-15 DIAGNOSIS — D519 Vitamin B12 deficiency anemia, unspecified: Secondary | ICD-10-CM

## 2016-03-15 DIAGNOSIS — E785 Hyperlipidemia, unspecified: Secondary | ICD-10-CM

## 2016-03-15 DIAGNOSIS — Z23 Encounter for immunization: Secondary | ICD-10-CM | POA: Diagnosis not present

## 2016-03-15 DIAGNOSIS — T50905A Adverse effect of unspecified drugs, medicaments and biological substances, initial encounter: Secondary | ICD-10-CM

## 2016-03-15 DIAGNOSIS — E1169 Type 2 diabetes mellitus with other specified complication: Secondary | ICD-10-CM | POA: Diagnosis not present

## 2016-03-15 DIAGNOSIS — IMO0001 Reserved for inherently not codable concepts without codable children: Secondary | ICD-10-CM

## 2016-03-15 DIAGNOSIS — E1165 Type 2 diabetes mellitus with hyperglycemia: Secondary | ICD-10-CM

## 2016-03-15 DIAGNOSIS — E538 Deficiency of other specified B group vitamins: Secondary | ICD-10-CM

## 2016-03-15 DIAGNOSIS — G44229 Chronic tension-type headache, not intractable: Secondary | ICD-10-CM

## 2016-03-15 LAB — VITAMIN B12: Vitamin B-12: 196 pg/mL — ABNORMAL LOW (ref 211–911)

## 2016-03-15 LAB — COMPREHENSIVE METABOLIC PANEL
ALT: 81 U/L — ABNORMAL HIGH (ref 0–53)
AST: 41 U/L — ABNORMAL HIGH (ref 0–37)
Albumin: 4.8 g/dL (ref 3.5–5.2)
Alkaline Phosphatase: 77 U/L (ref 39–117)
BUN: 14 mg/dL (ref 6–23)
CO2: 29 mEq/L (ref 19–32)
Calcium: 10 mg/dL (ref 8.4–10.5)
Chloride: 102 mEq/L (ref 96–112)
Creatinine, Ser: 1.12 mg/dL (ref 0.40–1.50)
GFR: 73.23 mL/min (ref >60.00)
Glucose, Bld: 224 mg/dL — ABNORMAL HIGH (ref 70–99)
Potassium: 4.6 mEq/L (ref 3.5–5.1)
Sodium: 137 mEq/L (ref 135–145)
Total Bilirubin: 0.6 mg/dL (ref 0.2–1.2)
Total Protein: 7.5 g/dL (ref 6.0–8.3)

## 2016-03-15 LAB — MICROALBUMIN / CREATININE URINE RATIO
Creatinine,U: 65.4 mg/dL
Microalb Creat Ratio: 5 mg/g (ref 0.0–30.0)
Microalb, Ur: 3.3 mg/dL — ABNORMAL HIGH (ref 0.0–1.9)

## 2016-03-15 LAB — LIPID PANEL
Cholesterol: 178 mg/dL (ref 0–200)
HDL: 52.7 mg/dL (ref >39.00)
LDL Cholesterol: 105 mg/dL — ABNORMAL HIGH (ref 0–99)
NonHDL: 124.89
Total CHOL/HDL Ratio: 3
Triglycerides: 100 mg/dL (ref 0.0–149.0)
VLDL: 20 mg/dL (ref 0.0–40.0)

## 2016-03-15 LAB — POCT GLYCOSYLATED HEMOGLOBIN (HGB A1C): Hemoglobin A1C: 9.4

## 2016-03-15 LAB — LDL CHOLESTEROL, DIRECT: Direct LDL: 0 mg/dL

## 2016-03-15 MED ORDER — GLUCOSE BLOOD VI STRP: ORAL_STRIP | 12 refills | Status: DC

## 2016-03-15 MED ORDER — SITAGLIPTIN PHOS-METFORMIN HCL 50-1000 MG PO TABS: 1.0000 | ORAL_TABLET | Freq: Two times a day (BID) | ORAL | 11 refills | Status: DC

## 2016-03-15 MED ORDER — INSULIN DETEMIR 100 UNIT/ML FLEXPEN: 20.0000 [IU] | PEN_INJECTOR | Freq: Every day | SUBCUTANEOUS | Status: DC

## 2016-03-15 MED ORDER — CYCLOBENZAPRINE HCL 10 MG PO TABS: 10.0000 mg | ORAL_TABLET | Freq: Three times a day (TID) | ORAL | 1 refills | Status: DC | PRN

## 2016-03-15 NOTE — Patient Instructions (Signed)
We are starting Levemir (a basal insulin) to help lower your sugars,  20 units daily  Send me blood sugar readings in a week >  I want fastings and one other reading DAILY  THE OTHER READING CAN BE ANY OF THESE TIMES:   BEFORE LUNCH OR 2 HRS AFTER BEFORE DINNER OR 2 HOURS AFTER  FOR THE HEADACHE WE AREA ADDING a MUSCLE RELAXER (FLEXERIL) WHEN YOU GET HOME AT NIGHT  YOU CAN ALSO INCREASE THE ELAVIL TO 50 MG AT BEDTIME   IF YOUR B12 IS LOW,  YOU WILL NEED TO RESUME INJECTIONS WHICH YOU CAN DO YOURSELF AT HOME

## 2016-03-15 NOTE — Progress Notes (Signed)
Subjective:  Patient ID: Kenneth SpeckingDavid Sylvester, male    DOB: 1965/12/15  Age: 51 y.o. MRN: [id number]  CC: The primary encounter diagnosis was Uncontrolled type 2 diabetes mellitus without complication, without long-term current use of insulin (HCC). Diagnoses of Encounter for immunization, Hyperlipidemia associated with type 2 diabetes mellitus (HCC), Drug-induced vitamin B12 deficiency anemia, B12 deficiency, and Chronic tension-type headache, not intractable were also pertinent to this visit.  HPI Kenneth [name] presents for follow up on uncontrolled diabetes  With nephropathy , htn and obesity, B12 deficiency  and subacute onset chronic daily headaches.  He has been lost to follow up,  Was asked to return in  3 weeks after his June visit for a1c > 9 and to bring a log of blood sugars but did not.  He did not bring his log with him today either. Has been off of insulin for over a year but not opposed to resuming it.   fastings 150 to 220  Higher during illnesses.   Headaches still sharp in severity and occurring in both parietal areas but usually unilaterally.  Improved transiently with increased in elavil to 25 mg daily    Not taking daily advil , but using it prn 1-2 /week 400 to 600 mg for transient daily relief .  He has OSa and affirms that he is using his CPAP nightly for a minimum of 6 hours .  Has has a brain  MRI in the last 2 years that was normal.  No prior neurology evaluation .    B12 deficiency: received  b12 injection s x 2 in July and states that the front office told him he didn't have to come back . Has been taking a MVI since  Then. takes metformin.     Outpatient Medications Prior to Visit  Medication Sig Dispense Refill  . amitriptyline (ELAVIL) 25 MG tablet Take 1 tablet (25 mg total) by mouth at bedtime. 90 tablet 1  . INVOKANA 300 MG TABS tablet TAKE 1 TABLET BY MOUTH EVERY DAY 30 tablet 11  . lisinopril (PRINIVIL,ZESTRIL) 10 MG tablet TAKE 1 TABLET (10 MG TOTAL) BY MOUTH  DAILY. 90 tablet 2  . sertraline (ZOLOFT) 100 MG tablet TAKE 1 TABLET (100 MG TOTAL) BY MOUTH DAILY. 90 tablet 1  . simvastatin (ZOCOR) 20 MG tablet TAKE 1 TABLET BY MOUTH EVERY DAY 30 tablet 5  . glucose blood test strip For One touch Meter  Use to check blood sugars twice daily 100 each 12  . JANUMET 50-1000 MG tablet TAKE 1 TABLET BY MOUTH TWICE A DAY WITH MEALS 60 tablet 11  . insulin NPH Human (NOVOLIN N RELION) 100 UNIT/ML injection Inject 0.15 mLs (15 Units total) into the skin at bedtime. (Patient not taking: Reported on 03/15/2016) 10 mL 11  . INSULIN SYRINGE 1CC/29G (RELION INSULIN SYRINGE) 29G X 1/2" 1 ML MISC Use as directed (Patient not taking: Reported on 03/15/2016) 100 each 0   No facility-administered medications prior to visit.     Review of Systems;  Patient denies fevers, malaise, unintentional weight loss, skin rash, eye pain, sinus congestion and sinus pain, sore throat, dysphagia,  hemoptysis , cough, dyspnea, wheezing, chest pain, palpitations, orthopnea, edema, abdominal pain, nausea, melena, diarrhea, constipation, flank pain, dysuria, hematuria, urinary  Frequency, nocturia, numbness, tingling, seizures,  Focal weakness, Loss of consciousness,  Tremor, insomnia, depression, anxiety, and suicidal ideation.      Objective:  BP 120/86   Pulse 86   Temp 98.4 F (  36.9 C) (Oral)   Resp 16   Wt 232 lb (105.2 kg)   SpO2 97%   BMI 32.36 kg/m   BP Readings from Last 3 Encounters:  03/15/16 120/86  08/09/15 110/70  12/23/14 109/70    Wt Readings from Last 3 Encounters:  03/15/16 232 lb (105.2 kg)  08/09/15 236 lb (107 kg)  12/23/14 242 lb 2 oz (109.8 kg)    General appearance: alert, cooperative and appears stated age Ears: normal TM's and external ear canals both ears Throat: lips, mucosa, and tongue normal; teeth and gums normal Neck: no adenopathy, no carotid bruit, supple, symmetrical, trachea midline and thyroid not enlarged, symmetric, no  tenderness/mass/nodules Back: symmetric, no curvature. ROM normal. No CVA tenderness. Lungs: clear to auscultation bilaterally Heart: regular rate and rhythm, S1, S2 normal, no murmur, click, rub or gallop Abdomen: soft, non-tender; bowel sounds normal; no masses,  no organomegaly Pulses: 2+ and symmetric Skin: Skin color, texture, turgor normal. No rashes or lesions Lymph nodes: Cervical, supraclavicular, and axillary nodes normal.  Lab Results  Component Value Date   HGBA1C 9.4 03/15/2016   HGBA1C 9.2 (H) 08/09/2015   HGBA1C 8.0 (H) 12/23/2014    Lab Results  Component Value Date   CREATININE 1.12 03/15/2016   CREATININE 1.22 08/09/2015   CREATININE 1.24 02/28/2014    Lab Results  Component Value Date   WBC 8.7 03/11/2013   HGB 15.5 03/11/2013   HCT 46.8 03/11/2013   PLT 216.0 03/11/2013   GLUCOSE 224 (H) 03/15/2016   CHOL 178 03/15/2016   TRIG 100.0 03/15/2016   HDL 52.70 03/15/2016   LDLDIRECT 0.0 03/15/2016   LDLCALC 105 (H) 03/15/2016   ALT 81 (H) 03/15/2016   AST 41 (H) 03/15/2016   NA 137 03/15/2016   K 4.6 03/15/2016   CL 102 03/15/2016   CREATININE 1.12 03/15/2016   BUN 14 03/15/2016   CO2 29 03/15/2016   TSH 1.16 03/11/2013   PSA 0.67 08/09/2015   HGBA1C 9.4 03/15/2016   MICROALBUR 3.3 (H) 03/15/2016    No results found.  Assessment & Plan:   Problem List Items Addressed This Visit    B12 deficiency    Persistent despite oral supplementation.  Takes metformin,  Not vegetarian.  Patient will self administer b12 via IM injection  Lab Results  Component Value Date   VITAMINB12 196 (L) 03/15/2016         Diabetes mellitus type 2, uncontrolled, without complications (HCC) - Primary    No improvement in 6 months, largely due to patient's lack of motivation, follow up and insufficient monitoring.  Adding Levemir today starting with 20 units .  encouraged to use MyChart account to submit weekly blood sugars both fasting,  Pre and post prandial .  Continue metformin and invokkana.  Lab Results  Component Value Date   HGBA1C 9.4 03/15/2016   Lab Results  Component Value Date   MICROALBUR 3.3 (H) 03/15/2016   Lab Results  Component Value Date   CREATININE 1.12 03/15/2016         Relevant Medications   sitaGLIPtin-metformin (JANUMET) 50-1000 MG tablet   glucose blood test strip   Insulin Detemir (LEVEMIR FLEXPEN) 100 UNIT/ML Pen   Other Relevant Orders   POCT HgB A1C (Completed)   Comprehensive metabolic panel (Completed)   Lipid panel (Completed)   Microalbumin / creatinine urine ratio (Completed)   Headache    normal brain MRI Nov 2015. Transient improvement wit low dose elavil,  Contributing  factors addressed (OSA, using CPAP). Advised to try increasing elavil to 50 mg if muscle relaxer at bedtime does not help.        Relevant Medications   cyclobenzaprine (FLEXERIL) 10 MG tablet    Other Visit Diagnoses    Encounter for immunization       Relevant Orders   Flu Vaccine QUAD 36+ mos IM (Completed)   Hyperlipidemia associated with type 2 diabetes mellitus (HCC)       Relevant Medications   sitaGLIPtin-metformin (JANUMET) 50-1000 MG tablet   Insulin Detemir (LEVEMIR FLEXPEN) 100 UNIT/ML Pen   Other Relevant Orders   LDL cholesterol, direct (Completed)   Drug-induced vitamin B12 deficiency anemia       Relevant Medications   cyanocobalamin (,VITAMIN B-12,) 1000 MCG/ML injection   Other Relevant Orders   Vitamin B12 (Completed)      I have changed Mr. Miltenberger's JANUMET to sitaGLIPtin-metformin. I have also changed his glucose blood. I am also having him start on Insulin Detemir, cyclobenzaprine, cyanocobalamin, and SYRINGE 3CC/25GX1". Additionally, I am having him maintain his insulin NPH Human, INSULIN SYRINGE 1CC/29G, INVOKANA, amitriptyline, simvastatin, lisinopril, and sertraline.  Meds ordered this encounter  Medications  . sitaGLIPtin-metformin (JANUMET) 50-1000 MG tablet    Sig: Take 1 tablet by  mouth 2 (two) times daily with a meal.    Dispense:  60 tablet    Refill:  11  . glucose blood test strip    Sig: For One touch Meter  Use to check blood sugars three times  daily    Dispense:  100 each    Refill:  12    Keep on file for future refills  . Insulin Detemir (LEVEMIR FLEXPEN) 100 UNIT/ML Pen    Sig: Inject 20 Units into the skin daily at 10 pm.    Dispense:  15 mL    Refill:  o  . cyclobenzaprine (FLEXERIL) 10 MG tablet    Sig: Take 1 tablet (10 mg total) by mouth 3 (three) times daily as needed for muscle spasms.    Dispense:  90 tablet    Refill:  1  . cyanocobalamin (,VITAMIN B-12,) 1000 MCG/ML injection    Sig: Inject 1 mL (1,000 mcg total) into the muscle once a week. For 3 weeks,  Then monthly thereafter    Dispense:  10 mL    Refill:  5  . Syringe/Needle, Disp, (SYRINGE 3CC/25GX1") 25G X 1" 3 ML MISC    Sig: Use for b12 injections    Dispense:  50 each    Refill:  0    Medications Discontinued During This Encounter  Medication Reason  . JANUMET 50-1000 MG tablet Reorder  . glucose blood test strip Reorder  A total of 40 minutes was spent with patient more than half of which was spent in counseling patient on the above mentioned issues , reviewing and explaining recent labs and imaging studies done, and coordination of care. Follow-up: Return in about 3 months (around 06/12/2016) for follow up diabetes.   Sherlene Shams, MD

## 2016-03-15 NOTE — Progress Notes (Signed)
Pre visit review using our clinic review tool, if applicable. No additional management support is needed unless otherwise documented below in the visit note. 

## 2016-03-17 ENCOUNTER — Encounter: Payer: Self-pay | Admitting: Internal Medicine

## 2016-03-17 DIAGNOSIS — E538 Deficiency of other specified B group vitamins: Secondary | ICD-10-CM | POA: Insufficient documentation

## 2016-03-17 MED ORDER — CYANOCOBALAMIN 1000 MCG/ML IJ SOLN: 1000.0000 ug | INTRAMUSCULAR | 5 refills | Status: DC

## 2016-03-17 MED ORDER — "SYRINGE 25G X 1"" 3 ML MISC": 0 refills | Status: DC

## 2016-03-17 NOTE — Assessment & Plan Note (Addendum)
No improvement in 6 months, largely due to patient's lack of motivation, follow up and insufficient monitoring.  Adding Levemir today starting with 20 units .  encouraged to use MyChart account to submit weekly blood sugars both fasting,  Pre and post prandial . Continue metformin and invokkana.  Lab Results  Component Value Date   HGBA1C 9.4 03/15/2016   Lab Results  Component Value Date   MICROALBUR 3.3 (H) 03/15/2016   Lab Results  Component Value Date   CREATININE 1.12 03/15/2016

## 2016-03-17 NOTE — Assessment & Plan Note (Signed)
Persistent despite oral supplementation.  Takes metformin,  Not vegetarian.  Patient will self administer b12 via IM injection  Lab Results  Component Value Date   VITAMINB12 196 (L) 03/15/2016

## 2016-03-17 NOTE — Assessment & Plan Note (Signed)
normal brain MRI Nov 2015. Transient improvement wit low dose elavil,  Contributing  factors addressed (OSA, using CPAP). Advised to try increasing elavil to 50 mg if muscle relaxer at bedtime does not help.

## 2016-03-18 ENCOUNTER — Other Ambulatory Visit: Payer: Self-pay | Admitting: *Deleted

## 2016-03-18 MED ORDER — INSULIN PEN NEEDLE 31G X 8 MM MISC: 1.0000 | Freq: Every day | 0 refills | Status: DC

## 2016-03-28 IMAGING — MR MRI HEAD WITHOUT CONTRAST
10 series · 48 of 48 positions shown · non-contrast
Comparison: None.

CLINICAL DATA: Headache at the vertex.  Symptoms for 2 years.

EXAM:
MRI HEAD WITHOUT CONTRAST
TECHNIQUE: Multiplanar, multiecho pulse sequences of the brain and surrounding
structures were obtained without intravenous contrast.

[Series 2: T1 · sagittal · 5.0mm · 0.45mm/px · 4 of 30 slices shown (1 of 2)]
[im 1/30]
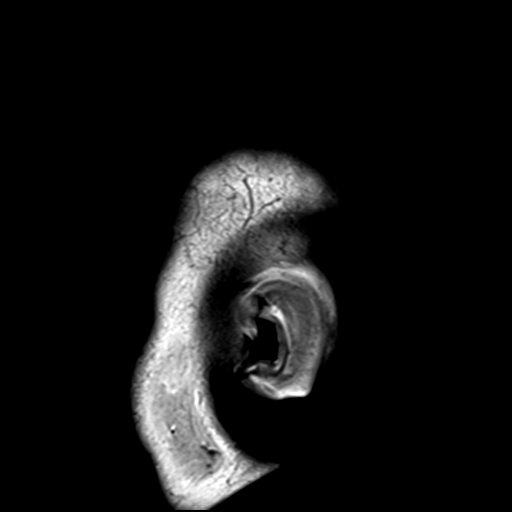
[im 10/30]
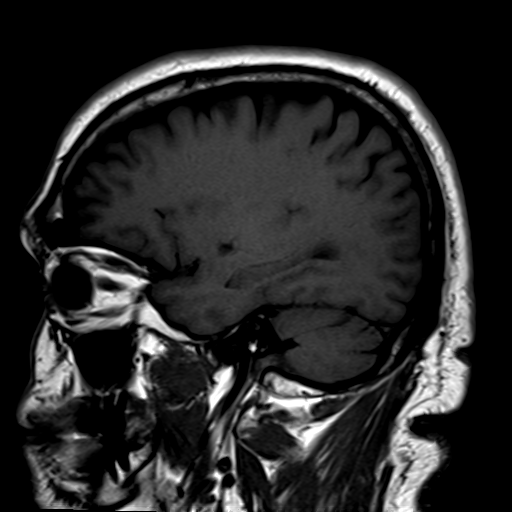
[im 20/30]
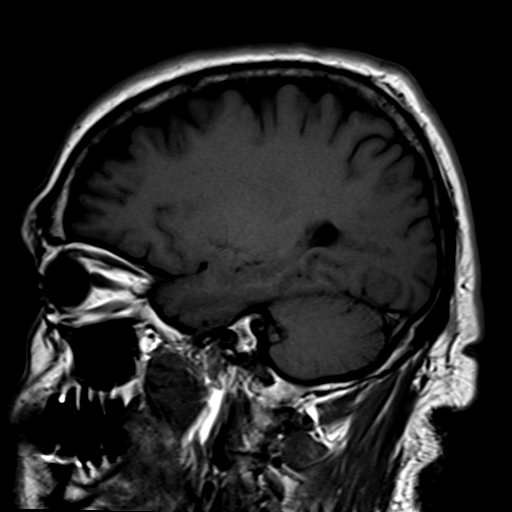
[im 30/30]
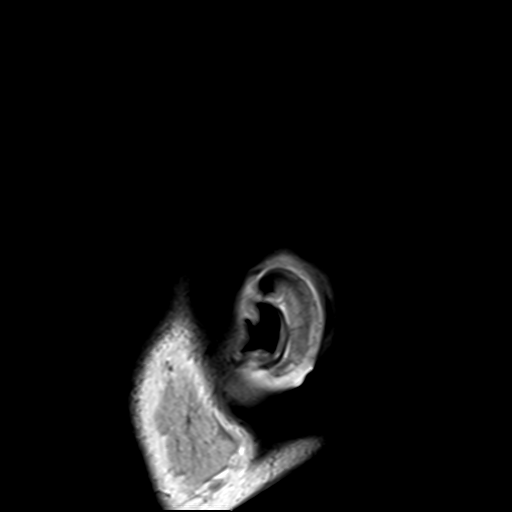

[Series 4: DWI · axial · 3.0mm · 1.80mm/px · z∈[-79,+108]mm · 6 of 49 slices shown (1 of 4)]
[im 1/49]
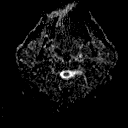
[im 10/49]
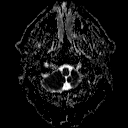
[im 20/49]
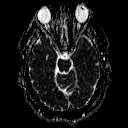
[im 29/49]
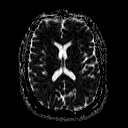
[im 39/49]
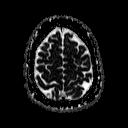
[im 49/49]
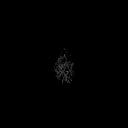

[Series 6: DWI · coronal · 3.0mm · 1.80mm/px · 6 of 52 slices shown (2 of 4)]
[im 1/52]
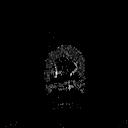
[im 11/52]
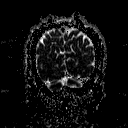
[im 21/52]
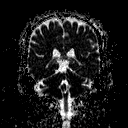
[im 31/52]
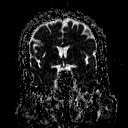
[im 41/52]
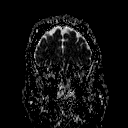
[im 52/52]
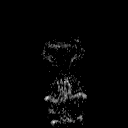

[Series 7: T2 · axial · 5.0mm · 0.60mm/px · z∈[-75,+106]mm · 3 of 29 slices shown (1 of 3)]
[im 1/29]
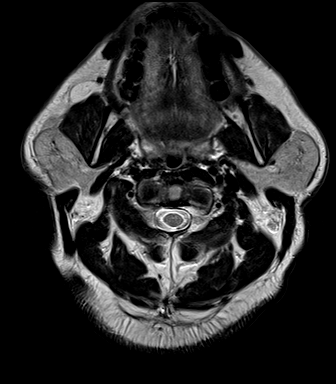
[im 15/29]
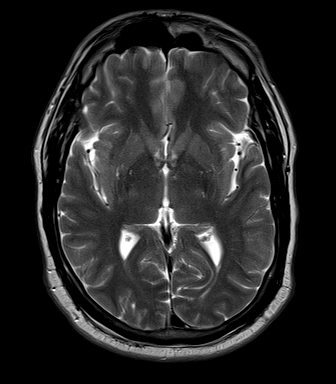
[im 29/29]
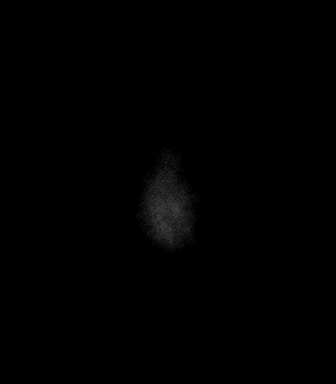

[Series 8: FLAIR · axial · 5.0mm · 0.45mm/px · z∈[-75,+106]mm · 3 of 29 slices shown]
[im 1/29]
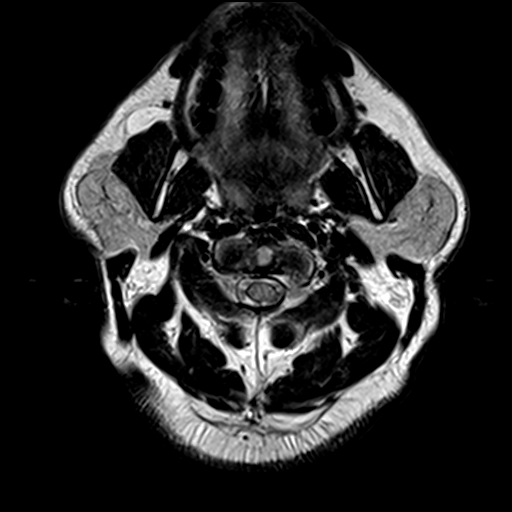
[im 15/29]
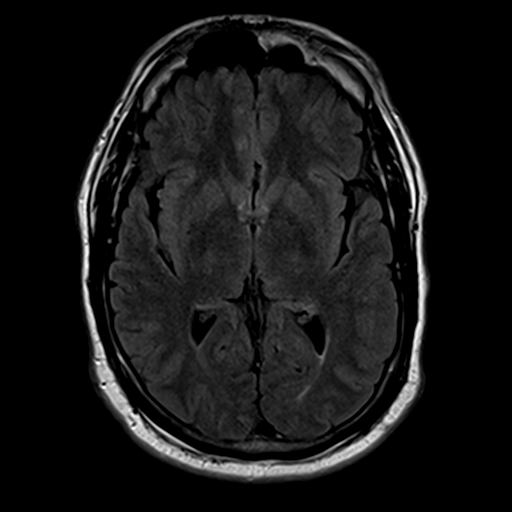
[im 29/29]
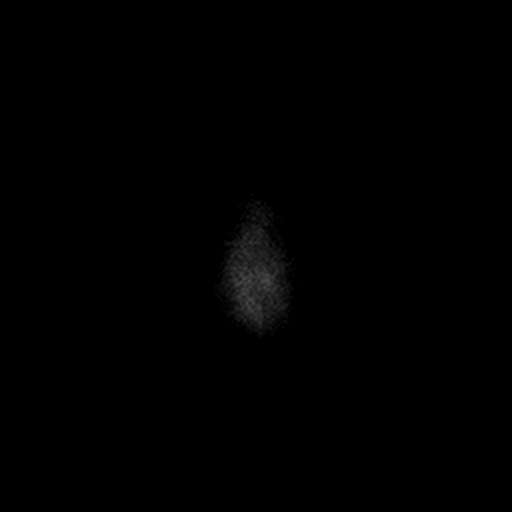

[Series 9: T2 · axial · 5.0mm · 0.45mm/px · z∈[-75,+106]mm · 3 of 29 slices shown (2 of 3)]
[im 1/29]
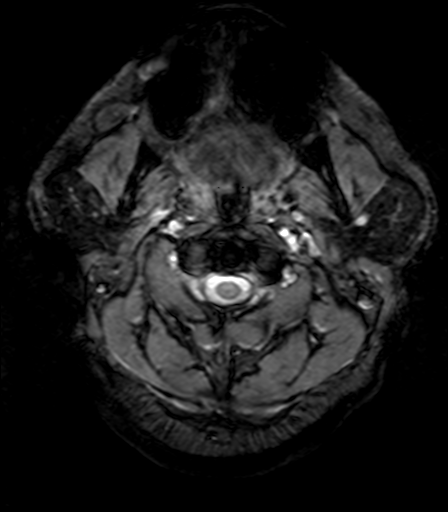
[im 15/29]
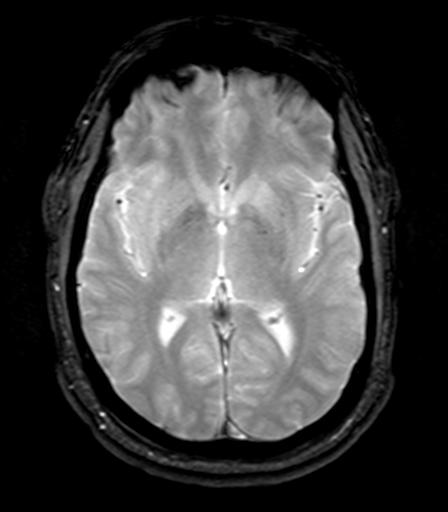
[im 29/29]
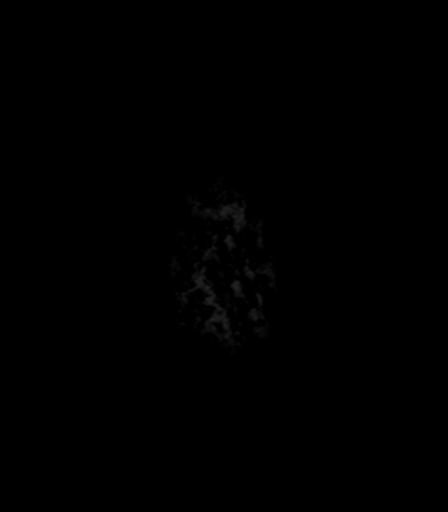

[Series 10: T1 · axial · 3.0mm · 1.00mm/px · z∈[-72,+105]mm · 7 of 60 slices shown (2 of 2)]
[im 1/60]
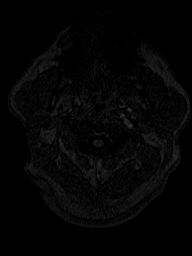
[im 10/60]
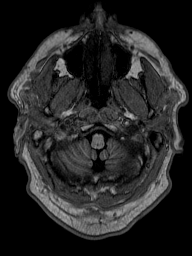
[im 20/60]
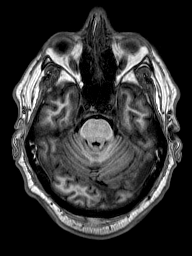
[im 30/60]
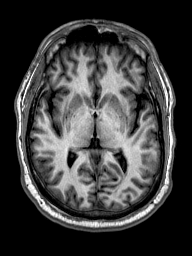
[im 40/60]
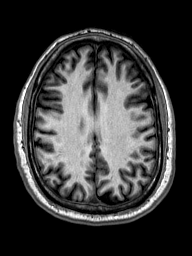
[im 50/60]
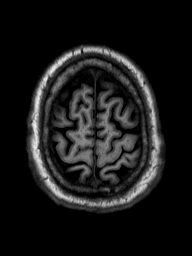
[im 60/60]
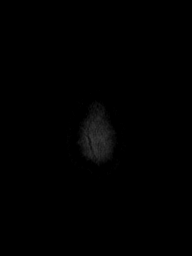

[Series 11: T2 · coronal · 5.0mm · 0.49mm/px · 4 of 33 slices shown (3 of 3)]
[im 1/33]
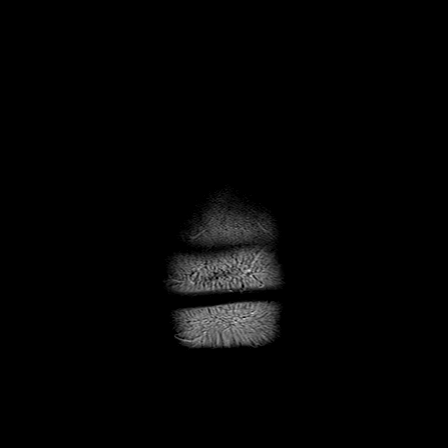
[im 11/33]
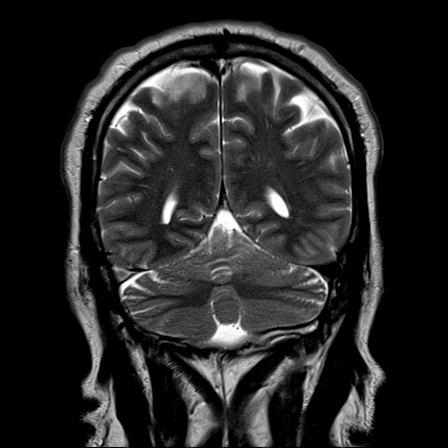
[im 22/33]
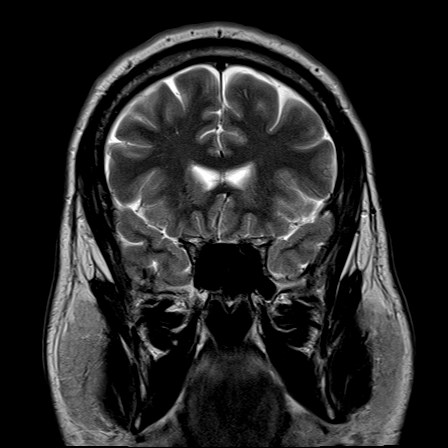
[im 33/33]
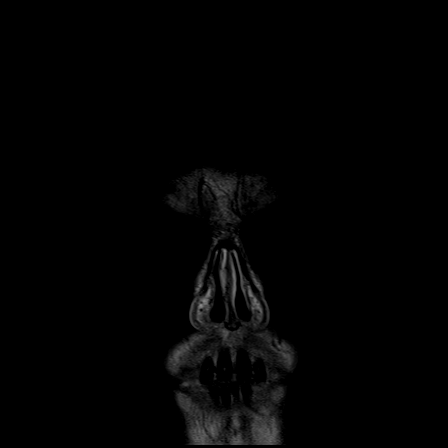

[Series 100: DWI · axial · 3.0mm · 1.80mm/px · z∈[-79,+108]mm · 6 of 49 slices shown (3 of 4)]
[im 1/49]
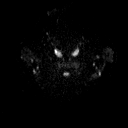
[im 10/49]
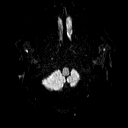
[im 20/49]
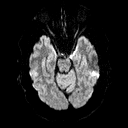
[im 29/49]
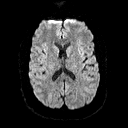
[im 39/49]
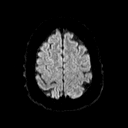
[im 49/49]
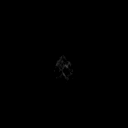

[Series 101: DWI · coronal · 3.0mm · 1.80mm/px · 6 of 52 slices shown (4 of 4)]
[im 1/52]
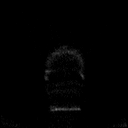
[im 11/52]
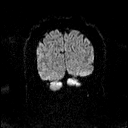
[im 21/52]
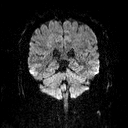
[im 31/52]
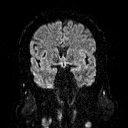
[im 41/52]
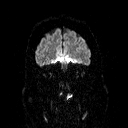
[im 52/52]
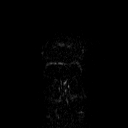

[48 of 48 positions shown; findings below may reference images not displayed]

FINDINGS: No evidence for acute infarction, hemorrhage, mass lesion,
hydrocephalus, or extra-axial fluid. Normal for age cerebral volume.
No appreciable white matter disease. Flow voids are maintained
throughout the carotid, basilar, and vertebral arteries. There are
no areas of chronic hemorrhage. Pituitary, pineal, and cerebellar
tonsils unremarkable. No upper cervical lesions.

There is no visible sinus or mastoid disease. The orbits are
negative. Shotty cervical adenopathy is incompletely evaluated.

There is a lesion in the posterior aspect of the LEFT parotid gland
incompletely evaluated on this noncontrast brain exam. The lesion
appears well encapsulated, has a heterogeneous but predominantly T2
hyperintense signal characteristic, and displays facilitated
diffusion. The lesion is probably located in the superficial lobe of
the parotid, but potentially straddles the superficial and deep
lobe. The lesion measures 13 x 13 x 14 mm. Differential
considerations would include salivary gland tumor versus
intraparotid lymph node. Recommend physical examination and clinical
correlation. If further investigation is desired, CT neck with
contrast or MRI neck without and with contrast recommended for
further evaluation.
IMPRESSION: No acute intracranial findings.

13 x 13 x 14 mm lesion in the posterior aspect of the LEFT parotid
gland, see discussion above. Further investigation is warranted.

## 2016-03-31 ENCOUNTER — Encounter: Payer: Self-pay | Admitting: Internal Medicine

## 2016-04-01 ENCOUNTER — Telehealth: Payer: Self-pay | Admitting: Internal Medicine

## 2016-04-01 ENCOUNTER — Other Ambulatory Visit: Payer: Self-pay | Admitting: Internal Medicine

## 2016-04-01 DIAGNOSIS — IMO0001 Reserved for inherently not codable concepts without codable children: Secondary | ICD-10-CM

## 2016-04-01 DIAGNOSIS — E1165 Type 2 diabetes mellitus with hyperglycemia: Principal | ICD-10-CM

## 2016-04-01 MED ORDER — EMPAGLIFLOZIN 25 MG PO TABS: 25.0000 mg | ORAL_TABLET | Freq: Every day | ORAL | 2 refills | Status: DC

## 2016-04-06 ENCOUNTER — Other Ambulatory Visit: Payer: Self-pay | Admitting: Nurse Practitioner

## 2016-04-08 NOTE — Telephone Encounter (Signed)
Okay to refill? It was removed from med list recently.

## 2016-04-08 NOTE — Telephone Encounter (Signed)
The jardiance has replaced the invokanna .confirm that he has filled the jardiance.

## 2016-04-10 ENCOUNTER — Encounter: Payer: Self-pay | Admitting: *Deleted

## 2016-04-10 NOTE — Telephone Encounter (Signed)
Sent mychart message

## 2016-04-11 NOTE — Telephone Encounter (Signed)
Per mychart response: Yes, I am starting it tomorrow, per instructions to finish the invokana then start the jardiance

## 2016-05-22 ENCOUNTER — Other Ambulatory Visit: Payer: Self-pay | Admitting: Internal Medicine

## 2016-05-23 ENCOUNTER — Other Ambulatory Visit: Payer: Self-pay | Admitting: Internal Medicine

## 2016-05-23 MED ORDER — INSULIN DETEMIR 100 UNIT/ML FLEXPEN: 20.0000 [IU] | PEN_INJECTOR | Freq: Every day | SUBCUTANEOUS | Status: DC

## 2016-05-23 MED ORDER — INSULIN PEN NEEDLE 31G X 8 MM MISC: 1.0000 | Freq: Every day | 0 refills | Status: DC

## 2016-05-24 NOTE — Telephone Encounter (Signed)
rx has been printed, signed and faxed.  

## 2016-06-02 ENCOUNTER — Encounter: Payer: Self-pay | Admitting: Internal Medicine

## 2016-06-03 MED ORDER — INSULIN DETEMIR 100 UNIT/ML FLEXPEN: 30.0000 [IU] | PEN_INJECTOR | Freq: Every day | SUBCUTANEOUS | Status: DC

## 2016-06-03 NOTE — Telephone Encounter (Signed)
Faxed script to [facility].

## 2016-06-13 ENCOUNTER — Ambulatory Visit (INDEPENDENT_AMBULATORY_CARE_PROVIDER_SITE_OTHER): Payer: BLUE CROSS/BLUE SHIELD | Admitting: Internal Medicine

## 2016-06-13 VITALS — BP 118/80 | HR 88 | Temp 98.6°F | Resp 12 | Ht 71.0 in | Wt 231.4 lb

## 2016-06-13 DIAGNOSIS — E1165 Type 2 diabetes mellitus with hyperglycemia: Secondary | ICD-10-CM

## 2016-06-13 DIAGNOSIS — Z6833 Body mass index (BMI) 33.0-33.9, adult: Secondary | ICD-10-CM | POA: Diagnosis not present

## 2016-06-13 DIAGNOSIS — E538 Deficiency of other specified B group vitamins: Secondary | ICD-10-CM

## 2016-06-13 DIAGNOSIS — E1121 Type 2 diabetes mellitus with diabetic nephropathy: Secondary | ICD-10-CM

## 2016-06-13 DIAGNOSIS — IMO0002 Reserved for concepts with insufficient information to code with codable children: Secondary | ICD-10-CM

## 2016-06-13 DIAGNOSIS — E6609 Other obesity due to excess calories: Secondary | ICD-10-CM | POA: Diagnosis not present

## 2016-06-13 DIAGNOSIS — Z794 Long term (current) use of insulin: Secondary | ICD-10-CM | POA: Diagnosis not present

## 2016-06-13 DIAGNOSIS — IMO0001 Reserved for inherently not codable concepts without codable children: Secondary | ICD-10-CM

## 2016-06-13 LAB — COMPREHENSIVE METABOLIC PANEL
ALT: 53 U/L (ref 0–53)
AST: 29 U/L (ref 0–37)
Albumin: 4.8 g/dL (ref 3.5–5.2)
Alkaline Phosphatase: 71 U/L (ref 39–117)
BUN: 18 mg/dL (ref 6–23)
CO2: 25 mEq/L (ref 19–32)
Calcium: 9.8 mg/dL (ref 8.4–10.5)
Chloride: 103 mEq/L (ref 96–112)
Creatinine, Ser: 1.14 mg/dL (ref 0.40–1.50)
GFR: 71.68 mL/min (ref >60.00)
Glucose, Bld: 130 mg/dL — ABNORMAL HIGH (ref 70–99)
Potassium: 4.3 mEq/L (ref 3.5–5.1)
Sodium: 138 mEq/L (ref 135–145)
Total Bilirubin: 0.7 mg/dL (ref 0.2–1.2)
Total Protein: 7.6 g/dL (ref 6.0–8.3)

## 2016-06-13 LAB — LIPID PANEL
Cholesterol: 142 mg/dL (ref 0–200)
HDL: 53.4 mg/dL (ref >39.00)
LDL Cholesterol: 74 mg/dL (ref 0–99)
NonHDL: 88.43
Total CHOL/HDL Ratio: 3
Triglycerides: 72 mg/dL (ref 0.0–149.0)
VLDL: 14.4 mg/dL (ref 0.0–40.0)

## 2016-06-13 LAB — VITAMIN B12: Vitamin B-12: 364 pg/mL (ref 211–911)

## 2016-06-13 LAB — POCT GLYCOSYLATED HEMOGLOBIN (HGB A1C): Hemoglobin A1C: 8.5

## 2016-06-13 MED ORDER — INSULIN DETEMIR 100 UNIT/ML FLEXPEN: 10.0000 [IU] | PEN_INJECTOR | Freq: Every morning | SUBCUTANEOUS | Status: DC

## 2016-06-13 MED ORDER — INSULIN NPH (HUMAN) (ISOPHANE) 100 UNIT/ML ~~LOC~~ SUSP: 25.0000 [IU] | Freq: Every day | SUBCUTANEOUS | 11 refills | Status: DC

## 2016-06-13 MED ORDER — GLUCOSE BLOOD VI STRP: ORAL_STRIP | 12 refills | Status: DC

## 2016-06-13 NOTE — Progress Notes (Signed)
Pre-visit discussion using our clinic review tool. No additional management support is needed unless otherwise documented below in the visit note.  

## 2016-06-13 NOTE — Patient Instructions (Addendum)
Starting NPH at 25 units at bedtime.  Continue to check fasting sugars and send me readings in one week so I can adjust your dose    Continue 10 units of Levemir daily  In the morning   NO MORE CORN!!!  NO MORE CHIPS !   30 MINUTES  OF CARDIO GOAL 3 DAYS PER WEEK

## 2016-06-13 NOTE — Progress Notes (Signed)
Subjective:  Patient ID: Kenneth [name], male    DOB: 08/03/1965  Age: 51 y.o. MRN: [id number]  CC: The primary encounter diagnosis was Uncontrolled type 2 diabetes mellitus without complication, without long-term current use of insulin (HCC). Diagnoses of B12 deficiency, Uncontrolled type 2 diabetes mellitus without complication, with long-term current use of insulin (HCC), Uncontrolled type 2 diabetes mellitus with diabetic nephropathy, with long-term current use of insulin (HCC), and Class 1 obesity due to excess calories with serious comorbidity and body mass index (BMI) of 33.0 to 33.9 in adult were also pertinent to this visit.  HPI Kenneth [name] presents for 3 month follow up on diabetes.  Patient has no complaints today.  Patient is not  following a low glycemic index diet or  taking all prescribed medications regularly .   Fasting sugars have been under lover 160 80% of the time  and post prandial dinner sugars 150 to 170 except on rare occasions. .Over 160 80% of the time. Patient is not exercising regularly or intentionally trying to lose weight .  Patient has had an eye exam in the last 12 months and checks feet regularly for signs of infection.  Patient does not walk barefoot outside,  And denies an numbness tingling or burning in feet. Patient is up to date on all recommended vaccinationstype 2 dm  Lab Results  Component Value Date   MICROALBUR 3.3 (H) 03/15/2016    Using levemir 30 units .    Diet reviewed:  Breakfast sausage biscuit and diet coke .Marland Kitchen. Lunch [place]urkey sandwhich and diet coke ,  chips sometimes   .  Dinner   LAST B12 INjection was  In  April   Outpatient Medications Prior to Visit  Medication Sig Dispense Refill  . amitriptyline (ELAVIL) 10 MG tablet TAKE 1 TABLET BY MOUTH AT BEDTIME 30 tablet 11  . cyanocobalamin (,VITAMIN B-12,) 1000 MCG/ML injection Inject 1 mL (1,000 mcg total) into the muscle once a week. For 3 weeks,  Then monthly thereafter 10 mL 5  .  cyclobenzaprine (FLEXERIL) 10 MG tablet Take 1 tablet (10 mg total) by mouth 3 (three) times daily as needed for muscle spasms. 90 tablet 1  . empagliflozin (JARDIANCE) 25 MG TABS tablet Take 25 mg by mouth daily. 30 tablet 2  . Insulin Pen Needle 31G X 8 MM MISC Inject 1 each into the skin daily. 90 each 0  . INSULIN SYRINGE 1CC/29G (RELION INSULIN SYRINGE) 29G X 1/2" 1 ML MISC Use as directed 100 each 0  . lisinopril (PRINIVIL,ZESTRIL) 10 MG tablet TAKE 1 TABLET (10 MG TOTAL) BY MOUTH DAILY. 90 tablet 2  . sertraline (ZOLOFT) 100 MG tablet TAKE 1 TABLET (100 MG TOTAL) BY MOUTH DAILY. 90 tablet 1  . simvastatin (ZOCOR) 20 MG tablet TAKE 1 TABLET BY MOUTH EVERY DAY 30 tablet 5  . sitaGLIPtin-metformin (JANUMET) 50-1000 MG tablet Take 1 tablet by mouth 2 (two) times daily with a meal. 60 tablet 11  . Syringe/Needle, Disp, (SYRINGE 3CC/25GX1") 25G X 1" 3 ML MISC Use for b12 injections 50 each 0  . glucose blood test strip For One touch Meter  Use to check blood sugars three times  daily 100 each 12  . Insulin Detemir (LEVEMIR FLEXPEN) 100 UNIT/ML Pen Inject 30 Units into the skin daily at 10 pm. 15 mL o  . insulin NPH Human (NOVOLIN N RELION) 100 UNIT/ML injection Inject 0.15 mLs (15 Units total) into the skin at bedtime. 10 mL 11  No facility-administered medications prior to visit.     Review of Systems;  Patient denies headache, fevers, malaise, unintentional weight loss, skin rash, eye pain, sinus congestion and sinus pain, sore throat, dysphagia,  hemoptysis , cough, dyspnea, wheezing, chest pain, palpitations, orthopnea, edema, abdominal pain, nausea, melena, diarrhea, constipation, flank pain, dysuria, hematuria, urinary  Frequency, nocturia, numbness, tingling, seizures,  Focal weakness, Loss of consciousness,  Tremor, insomnia, depression, anxiety, and suicidal ideation.      Objective:  BP 118/80 (BP Location: Left Arm, Patient Position: Sitting, Cuff Size: Normal)   Pulse 88   Temp  98.6 F (37 C) (Oral)   Resp 12   Ht 5\' 11"  (1.803 m)   Wt 231 lb 6.4 oz (105 kg)   SpO2 97%   BMI 32.27 kg/m   BP Readings from Last 3 Encounters:  06/13/16 118/80  03/15/16 120/86  08/09/15 110/70    Wt Readings from Last 3 Encounters:  06/13/16 231 lb 6.4 oz (105 kg)  03/15/16 232 lb (105.2 kg)  08/09/15 236 lb (107 kg)    General appearance: alert, cooperative and appears stated age Ears: normal TM's and external ear canals both ears Throat: lips, mucosa, and tongue normal; teeth and gums normal Neck: no adenopathy, no carotid bruit, supple, symmetrical, trachea midline and thyroid not enlarged, symmetric, no tenderness/mass/nodules Back: symmetric, no curvature. ROM normal. No CVA tenderness. Lungs: clear to auscultation bilaterally Heart: regular rate and rhythm, S1, S2 normal, no murmur, click, rub or gallop Abdomen: soft, non-tender; bowel sounds normal; no masses,  no organomegaly Pulses: 2+ and symmetric Skin: Skin color, texture, turgor normal. No rashes or lesions Lymph nodes: Cervical, supraclavicular, and axillary nodes normal.  Lab Results  Component Value Date   HGBA1C 8.5 06/13/2016   HGBA1C 9.4 03/15/2016   HGBA1C 9.2 (H) 08/09/2015    Lab Results  Component Value Date   CREATININE 1.14 06/13/2016   CREATININE 1.12 03/15/2016   CREATININE 1.22 08/09/2015    Lab Results  Component Value Date   WBC 8.7 03/11/2013   HGB 15.5 03/11/2013   HCT 46.8 03/11/2013   PLT 216.0 03/11/2013   GLUCOSE 130 (H) 06/13/2016   CHOL 142 06/13/2016   TRIG 72.0 06/13/2016   HDL 53.40 06/13/2016   LDLDIRECT 0.0 03/15/2016   LDLCALC 74 06/13/2016   ALT 53 06/13/2016   AST 29 06/13/2016   NA 138 06/13/2016   K 4.3 06/13/2016   CL 103 06/13/2016   CREATININE 1.14 06/13/2016   BUN 18 06/13/2016   CO2 25 06/13/2016   TSH 1.16 03/11/2013   PSA 0.67 08/09/2015   HGBA1C 8.5 06/13/2016   MICROALBUR 3.3 (H) 03/15/2016    No results found.  Assessment & Plan:    Problem List Items Addressed This Visit    B12 deficiency    Persistent despite oral supplementation.  Takes metformin,  Not vegetarian.  Patient will continue to self administer b12 via IM injection monthly   Lab Results  Component Value Date   VITAMINB12 364 06/13/2016         Relevant Orders   Intrinsic Factor Antibodies   Vitamin B12 (Completed)   Obesity    I have addressed  BMI and recommended a low glycemic index diet utilizing smaller more frequent meals to increase metabolism.  I have also recommended that patient start exercising with a goal of 30 minutes of aerobic exercise a minimum of 5 days per week. Screening for lipid disorders, thyroid and diabetes to  be done today.        Relevant Medications   insulin NPH Human (NOVOLIN N RELION) 100 UNIT/ML injection   Insulin Detemir (LEVEMIR FLEXPEN) 100 UNIT/ML Pen   Uncontrolled type 2 diabetes mellitus with diabetic nephropathy, with long-term current use of insulin (HCC) - Primary    Fasting sugars elevated on levemir.  Changing basal insulin to NPH starting at 25 units.   Lab Results  Component Value Date   HGBA1C 8.5 06/13/2016   Lab Results  Component Value Date   MICROALBUR 3.3 (H) 03/15/2016         Relevant Medications   insulin NPH Human (NOVOLIN N RELION) 100 UNIT/ML injection   Insulin Detemir (LEVEMIR FLEXPEN) 100 UNIT/ML Pen      I have discontinued Mr. Ewy's glucose blood. I have also changed his insulin NPH Human and Insulin Detemir. Additionally, I am having him start on glucose blood. Lastly, I am having him maintain his INSULIN SYRINGE 1CC/29G, simvastatin, lisinopril, sitaGLIPtin-metformin, cyclobenzaprine, cyanocobalamin, SYRINGE 3CC/25GX1", empagliflozin, amitriptyline, sertraline, and Insulin Pen Needle.  Meds ordered this encounter  Medications  . glucose blood test strip    Sig: Contour,  Use twice daily to check blood sugars    Dispense:  100 each    Refill:  12  . insulin  NPH Human (NOVOLIN N RELION) 100 UNIT/ML injection    Sig: Inject 0.25 mLs (25 Units total) into the skin at bedtime.    Dispense:  10 mL    Refill:  11  . Insulin Detemir (LEVEMIR FLEXPEN) 100 UNIT/ML Pen    Sig: Inject 10 Units into the skin every morning.    Dispense:  15 mL    Refill:  o    Medications Discontinued During This Encounter  Medication Reason  . glucose blood test strip   . insulin NPH Human (NOVOLIN N RELION) 100 UNIT/ML injection Reorder  . Insulin Detemir (LEVEMIR FLEXPEN) 100 UNIT/ML Pen Reorder    Follow-up: Return in about 3 months (around 09/13/2016).   Sherlene Shams, MD

## 2016-06-15 NOTE — Assessment & Plan Note (Signed)
I have addressed  BMI and recommended a low glycemic index diet utilizing smaller more frequent meals to increase metabolism.  I have also recommended that patient start exercising with a goal of 30 minutes of aerobic exercise a minimum of 5 days per week. Screening for lipid disorders, thyroid and diabetes to be done today.   

## 2016-06-15 NOTE — Assessment & Plan Note (Addendum)
Fasting sugars elevated on levemir.  Changing basal insulin to NPH starting at 25 units.   Lab Results  Component Value Date   HGBA1C 8.5 06/13/2016   Lab Results  Component Value Date   MICROALBUR 3.3 (H) 03/15/2016

## 2016-06-15 NOTE — Assessment & Plan Note (Signed)
Persistent despite oral supplementation.  Takes metformin,  Not vegetarian.  Patient will continue to self administer b12 via IM injection monthly   Lab Results  Component Value Date   VITAMINB12 364 06/13/2016

## 2016-06-16 ENCOUNTER — Encounter: Payer: Self-pay | Admitting: Internal Medicine

## 2016-06-20 LAB — INTRINSIC FACTOR ANTIBODIES: Intrinsic Factor: NEGATIVE

## 2016-06-22 ENCOUNTER — Encounter: Payer: Self-pay | Admitting: Internal Medicine

## 2016-07-05 ENCOUNTER — Other Ambulatory Visit: Payer: Self-pay | Admitting: Internal Medicine

## 2016-08-22 ENCOUNTER — Other Ambulatory Visit: Payer: Self-pay

## 2016-08-22 ENCOUNTER — Other Ambulatory Visit: Payer: Self-pay | Admitting: Internal Medicine

## 2016-08-22 MED ORDER — SIMVASTATIN 20 MG PO TABS: 20.0000 mg | ORAL_TABLET | Freq: Every day | ORAL | 5 refills | Status: DC

## 2016-09-19 ENCOUNTER — Encounter: Payer: Self-pay | Admitting: Internal Medicine

## 2016-09-19 ENCOUNTER — Ambulatory Visit (INDEPENDENT_AMBULATORY_CARE_PROVIDER_SITE_OTHER): Payer: BLUE CROSS/BLUE SHIELD | Admitting: Internal Medicine

## 2016-09-19 VITALS — BP 130/74 | HR 94 | Temp 98.4°F | Resp 15 | Ht 71.0 in | Wt 237.0 lb

## 2016-09-19 DIAGNOSIS — Z9989 Dependence on other enabling machines and devices: Secondary | ICD-10-CM

## 2016-09-19 DIAGNOSIS — Z125 Encounter for screening for malignant neoplasm of prostate: Secondary | ICD-10-CM | POA: Diagnosis not present

## 2016-09-19 DIAGNOSIS — E1165 Type 2 diabetes mellitus with hyperglycemia: Secondary | ICD-10-CM | POA: Diagnosis not present

## 2016-09-19 DIAGNOSIS — Z794 Long term (current) use of insulin: Secondary | ICD-10-CM | POA: Diagnosis not present

## 2016-09-19 DIAGNOSIS — E538 Deficiency of other specified B group vitamins: Secondary | ICD-10-CM

## 2016-09-19 DIAGNOSIS — G44229 Chronic tension-type headache, not intractable: Secondary | ICD-10-CM

## 2016-09-19 DIAGNOSIS — G4733 Obstructive sleep apnea (adult) (pediatric): Secondary | ICD-10-CM | POA: Diagnosis not present

## 2016-09-19 DIAGNOSIS — IMO0002 Reserved for concepts with insufficient information to code with codable children: Secondary | ICD-10-CM

## 2016-09-19 DIAGNOSIS — E1121 Type 2 diabetes mellitus with diabetic nephropathy: Secondary | ICD-10-CM | POA: Diagnosis not present

## 2016-09-19 LAB — PSA: PSA: 1.21 ng/mL (ref 0.10–4.00)

## 2016-09-19 LAB — LIPID PANEL
Cholesterol: 137 mg/dL (ref 0–200)
HDL: 45.6 mg/dL (ref >39.00)
LDL Cholesterol: 61 mg/dL (ref 0–99)
NonHDL: 91.85
Total CHOL/HDL Ratio: 3
Triglycerides: 152 mg/dL — ABNORMAL HIGH (ref 0.0–149.0)
VLDL: 30.4 mg/dL (ref 0.0–40.0)

## 2016-09-19 LAB — HEMOGLOBIN A1C: Hgb A1c MFr Bld: 9 % — ABNORMAL HIGH (ref 4.6–6.5)

## 2016-09-19 LAB — COMPREHENSIVE METABOLIC PANEL
ALT: 48 U/L (ref 0–53)
AST: 23 U/L (ref 0–37)
Albumin: 4.6 g/dL (ref 3.5–5.2)
Alkaline Phosphatase: 76 U/L (ref 39–117)
BUN: 13 mg/dL (ref 6–23)
CO2: 24 mEq/L (ref 19–32)
Calcium: 9.3 mg/dL (ref 8.4–10.5)
Chloride: 103 mEq/L (ref 96–112)
Creatinine, Ser: 1.16 mg/dL (ref 0.40–1.50)
GFR: 70.18 mL/min (ref >60.00)
Glucose, Bld: 169 mg/dL — ABNORMAL HIGH (ref 70–99)
Potassium: 4.6 mEq/L (ref 3.5–5.1)
Sodium: 136 mEq/L (ref 135–145)
Total Bilirubin: 0.5 mg/dL (ref 0.2–1.2)
Total Protein: 7.1 g/dL (ref 6.0–8.3)

## 2016-09-19 LAB — VITAMIN B12: Vitamin B-12: 224 pg/mL (ref 211–911)

## 2016-09-19 MED ORDER — AMITRIPTYLINE HCL 10 MG PO TABS: 10.0000 mg | ORAL_TABLET | Freq: Every day | ORAL | 0 refills | Status: DC

## 2016-09-19 MED ORDER — CYCLOBENZAPRINE HCL 10 MG PO TABS: 10.0000 mg | ORAL_TABLET | Freq: Three times a day (TID) | ORAL | 1 refills | Status: DC | PRN

## 2016-09-19 NOTE — Assessment & Plan Note (Addendum)
Chronic, Still occurring daily .  Added amitriptyline in the past without dose titration .  Increased today.

## 2016-09-19 NOTE — Patient Instructions (Addendum)
Increase NPH dose to 30 units at bedtime   Continue jardiance,  Janumet.  Check sugar 2 hrs after  Dinner or right before your insulin dose ( I need 2 readings daily)  So I can determine if you need a dinnertime insulin dose.    we may add a dose of short acting insulin with dinner depending on nighttime numbers   Increase amitryptiline gradually m  Up to 50 mg  at bedtime  for headaches .  I will refill now so you do not run out          Kenneth MeckelJimmy [name] now makes a frozen breakfast frittata that can be microwaved in 2 minutes and is very low carb. Frittatas are similar to quiches without the crust    There are plenty of high protein low carb cookies,  But they're not called "cookies."  Look for them in the diet section  where the protein shakes are  Sold.   All of these have 5 g sugar or less : Power crunch Atkins bars (lemon bar)  KIND :the  "low glycemic index"  variety QUEST : (taste better after being microwaved for 15 sec  OUT OF THE WRAPPER)

## 2016-09-19 NOTE — Assessment & Plan Note (Signed)
Using nightly

## 2016-09-19 NOTE — Progress Notes (Addendum)
Subjective:  Patient ID: Governor SpeckingDavid Alvis, male    DOB: [birth year]  Age: 51 y.o. MRN: [id number]  CC: The primary encounter diagnosis was Uncontrolled type 2 diabetes mellitus with diabetic nephropathy, with long-term current use of insulin (HCC). Diagnoses of OSA on CPAP, Chronic tension-type headache, not intractable, B12 deficiency, and Prostate cancer screening were also pertinent to this visit.  HPI Governor SpeckingDavid Nowack presents for follow up on uncontrolled type 2 DM, managed with insulin.  Last seen in May a1c 8.5  Insulin changed to NPH starting at 25 units at bedtime   Has been checking fasting blood sugars , Morning sugars still 180 to 200. Not following a low GI diet.  Eats fast good several times per week . 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  Taking jardiance and janumet    Outpatient Medications Prior to Visit  Medication Sig Dispense Refill  . glucose blood test strip Contour,  Use twice daily to check blood sugars 100 each 12  . insulin NPH Human (NOVOLIN N RELION) 100 UNIT/ML injection Inject 0.25 mLs (25 Units total) into the skin at bedtime. 10 mL 11  . INSULIN SYRINGE 1CC/29G (RELION INSULIN SYRINGE) 29G X 1/2" 1 ML MISC Use as directed 100 each 0  . JARDIANCE 25 MG TABS tablet TAKE 1 TABLET BY MOUTH EVERY DAY 30 tablet 2  . lisinopril (PRINIVIL,ZESTRIL) 10 MG tablet TAKE 1 TABLET (10 MG TOTAL) BY MOUTH DAILY. 90 tablet 2  . sertraline (ZOLOFT) 100 MG tablet TAKE 1 TABLET (100 MG TOTAL) BY MOUTH DAILY. 90 tablet 1  . simvastatin (ZOCOR) 20 MG tablet Take 1 tablet (20 mg total) by mouth daily. 30 tablet 5  . sitaGLIPtin-metformin (JANUMET) 50-1000 MG tablet Take 1 tablet by mouth 2 (two) times daily with a meal. 60 tablet 11  . amitriptyline (ELAVIL) 10 MG tablet TAKE 1 TABLET BY MOUTH AT BEDTIME 30 tablet 11  . cyclobenzaprine (FLEXERIL) 10 MG tablet Take 1 tablet (10 mg total) by mouth 3 (three) times daily as needed  for muscle spasms. 90 tablet 1  . cyanocobalamin (,VITAMIN B-12,) 1000 MCG/ML injection Inject 1 mL (1,000 mcg total) into the muscle once a week. For 3 weeks,  Then monthly thereafter (Patient not taking: Reported on 09/19/2016) 10 mL 5  . Insulin Detemir (LEVEMIR FLEXPEN) 100 UNIT/ML Pen Inject 10 Units into the skin every morning. (Patient not taking: Reported on 09/19/2016) 15 mL o  . Insulin Pen Needle 31G X 8 MM MISC Inject 1 each into the skin daily. (Patient not taking: Reported on 09/19/2016) 90 each 0  . Syringe/Needle, Disp, (SYRINGE 3CC/25GX1") 25G X 1" 3 ML MISC Use for b12 injections (Patient not taking: Reported on 09/19/2016) 50 each 0   No facility-administered medications prior to visit.     Review of Systems;  Patient denies headache, fevers, malaise, unintentional weight loss, skin rash, eye pain, sinus congestion and sinus pain, sore throat, dysphagia,  hemoptysis , cough, dyspnea, wheezing, chest pain, palpitations, orthopnea, edema, abdominal pain, nausea, melena, diarrhea, constipation, flank pain, dysuria, hematuria, urinary  Frequency, nocturia, numbness, tingling, seizures,  Focal weakness, Loss of consciousness,  Tremor, insomnia, depression, anxiety, and suicidal ideation.     Lab Results  Component Value Date   MICROALBUR 3.3 (H) 03/15/2016     Objective:  BP 130/74 (BP Location: Left Arm, Patient Position: Sitting, Cuff Size: Normal)   Pulse 94   Temp 98.4 F (  36.9 C) (Oral)   Resp 15   Ht 5\' 11"  (1.803 m)   Wt 237 lb (107.5 kg)   SpO2 97%   BMI 33.05 kg/m    BP Readings from Last 3 Encounters:  09/19/16 130/74  06/13/16 118/80  03/15/16 120/86    Wt Readings from Last 3 Encounters:  09/19/16 237 lb (107.5 kg)  06/13/16 231 lb 6.4 oz (105 kg)  03/15/16 232 lb (105.2 kg)   .t General appearance: alert, cooperative and appears stated age Ears: normal TM's and external ear canals both ears Throat: lips, mucosa, and tongue normal; teeth and gums  normal Neck: no adenopathy, no carotid bruit, supple, symmetrical, trachea midline and thyroid not enlarged, symmetric, no tenderness/mass/nodules Back: symmetric, no curvature. ROM normal. No CVA tenderness. Lungs: clear to auscultation bilaterally Heart: regular rate and rhythm, S1, S2 normal, no murmur, click, rub or gallop Abdomen: soft, non-tender; bowel sounds normal; no masses,  no organomegaly Pulses: 2+ and symmetric Skin: Skin color, texture, turgor normal. No rashes or lesions Lymph nodes: Cervical, supraclavicular, and axillary nodes normal.  Lab Results  Component Value Date   HGBA1C 9.0 (H) 09/19/2016   HGBA1C 8.5 06/13/2016   HGBA1C 9.4 03/15/2016    Lab Results  Component Value Date   CREATININE 1.16 09/19/2016   CREATININE 1.14 06/13/2016   CREATININE 1.12 03/15/2016    Lab Results  Component Value Date   WBC 8.7 03/11/2013   HGB 15.5 03/11/2013   HCT 46.8 03/11/2013   PLT 216.0 03/11/2013   GLUCOSE 169 (H) 09/19/2016   CHOL 137 09/19/2016   TRIG 152.0 (H) 09/19/2016   HDL 45.60 09/19/2016   LDLDIRECT 0.0 03/15/2016   LDLCALC 61 09/19/2016   ALT 48 09/19/2016   AST 23 09/19/2016   NA 136 09/19/2016   K 4.6 09/19/2016   CL 103 09/19/2016   CREATININE 1.16 09/19/2016   BUN 13 09/19/2016   CO2 24 09/19/2016   TSH 1.16 03/11/2013   PSA 1.21 09/19/2016   HGBA1C 9.0 (H) 09/19/2016   MICROALBUR 3.3 (H) 03/15/2016    No results found.  Assessment & Plan:   Problem List Items Addressed This Visit    Uncontrolled type 2 diabetes mellitus with diabetic nephropathy, with long-term current use of insulin (HCC) - Primary    Worsening control  due to dietary noncompliance. Does not communicate with me between visits despite availability of Mychart A total of 40 minutes was spent with patient more than half of which was spent in counseling patient on the above mentioned issues including need for dietary restraint. Advised to increase NPH to 30 units at  bedtime and submit sugars for dose titration.  Directed to check sugars twice daily Discussed adding short acting lispro with dinner   Lab Results  Component Value Date   HGBA1C 9.0 (H) 09/19/2016         Relevant Orders   Hemoglobin A1c (Completed)   Comprehensive metabolic panel (Completed)   Lipid panel (Completed)   OSA on CPAP    Using nightly.       Headache    Chronic, Still occurring daily .  Added amitriptyline in the past without dose titration .  Increased today.       Relevant Medications   cyclobenzaprine (FLEXERIL) 10 MG tablet   amitriptyline (ELAVIL) 10 MG tablet   B12 deficiency   Relevant Orders   Vitamin B12 (Completed)    Other Visit Diagnoses    Prostate cancer screening  Relevant Orders   PSA (Completed)     A total of 40 minutes was spent with patient more than half of which was spent in counseling patient on the above mentioned issues , reviewing and explaining recent labs and imaging studies done, and coordination of care.  I have discontinued Mr. Kinn's cyanocobalamin, SYRINGE 3CC/25GX1", Insulin Pen Needle, and Insulin Detemir. I have also changed his amitriptyline. Additionally, I am having him maintain his INSULIN SYRINGE 1CC/29G, sitaGLIPtin-metformin, sertraline, glucose blood, insulin NPH Human, JARDIANCE, simvastatin, lisinopril, and cyclobenzaprine.  Meds ordered this encounter  Medications  . cyclobenzaprine (FLEXERIL) 10 MG tablet    Sig: Take 1 tablet (10 mg total) by mouth 3 (three) times daily as needed for muscle spasms.    Dispense:  90 tablet    Refill:  1  . amitriptyline (ELAVIL) 10 MG tablet    Sig: Take 1 tablet (10 mg total) by mouth at bedtime.    Dispense:  90 tablet    Refill:  0    Medications Discontinued During This Encounter  Medication Reason  . cyanocobalamin (,VITAMIN B-12,) 1000 MCG/ML injection Patient has not taken in last 30 days  . Insulin Detemir (LEVEMIR FLEXPEN) 100 UNIT/ML Pen Patient has  not taken in last 30 days  . Insulin Pen Needle 31G X 8 MM MISC Patient has not taken in last 30 days  . Syringe/Needle, Disp, (SYRINGE 3CC/25GX1") 25G X 1" 3 ML MISC Patient has not taken in last 30 days  . cyclobenzaprine (FLEXERIL) 10 MG tablet Reorder  . amitriptyline (ELAVIL) 10 MG tablet Reorder    Follow-up: No Follow-up on file.   Sherlene Shams, MD

## 2016-09-22 NOTE — Assessment & Plan Note (Addendum)
Worsening control  due to dietary noncompliance. Does not communicate with me between visits despite availability of Mychart A total of 40 minutes was spent with patient more than half of which was spent in counseling patient on the above mentioned issues including need for dietary restraint. Advised to increase NPH to 30 units at bedtime and submit sugars for dose titration.  Directed to check sugars twice daily Discussed adding short acting lispro with dinner   Lab Results  Component Value Date   HGBA1C 9.0 (H) 09/19/2016

## 2016-09-30 ENCOUNTER — Other Ambulatory Visit: Payer: Self-pay | Admitting: Internal Medicine

## 2016-10-07 ENCOUNTER — Encounter: Payer: Self-pay | Admitting: Internal Medicine

## 2016-10-07 DIAGNOSIS — E1121 Type 2 diabetes mellitus with diabetic nephropathy: Secondary | ICD-10-CM

## 2016-10-07 DIAGNOSIS — IMO0002 Reserved for concepts with insufficient information to code with codable children: Secondary | ICD-10-CM

## 2016-10-07 DIAGNOSIS — Z794 Long term (current) use of insulin: Principal | ICD-10-CM

## 2016-10-07 DIAGNOSIS — E1165 Type 2 diabetes mellitus with hyperglycemia: Principal | ICD-10-CM

## 2016-10-09 MED ORDER — BLOOD GLUCOSE MONITOR KIT: PACK | 0 refills | Status: AC

## 2016-10-09 MED ORDER — "SYRINGE 25G X 1"" 3 ML MISC": 0 refills | Status: DC

## 2016-10-09 MED ORDER — CYANOCOBALAMIN 1000 MCG/ML IJ SOLN: 1000.0000 ug | INTRAMUSCULAR | 3 refills | Status: DC

## 2016-10-09 NOTE — Assessment & Plan Note (Addendum)
Advised to increase NPH bedtime to 35 units. Aug 29

## 2016-10-11 ENCOUNTER — Other Ambulatory Visit: Payer: Self-pay | Admitting: Internal Medicine

## 2016-11-06 NOTE — Telephone Encounter (Signed)
Orders

## 2016-11-07 NOTE — Telephone Encounter (Signed)
Error

## 2016-11-11 ENCOUNTER — Other Ambulatory Visit: Payer: Self-pay | Admitting: Internal Medicine

## 2016-11-14 ENCOUNTER — Encounter: Payer: Self-pay | Admitting: Internal Medicine

## 2016-11-16 ENCOUNTER — Other Ambulatory Visit: Payer: Self-pay | Admitting: Internal Medicine

## 2016-11-21 ENCOUNTER — Other Ambulatory Visit: Payer: Self-pay | Admitting: Internal Medicine

## 2016-11-27 ENCOUNTER — Other Ambulatory Visit: Payer: Self-pay | Admitting: Internal Medicine

## 2016-12-25 ENCOUNTER — Encounter: Payer: Self-pay | Admitting: Internal Medicine

## 2016-12-25 ENCOUNTER — Ambulatory Visit: Payer: BLUE CROSS/BLUE SHIELD | Admitting: Internal Medicine

## 2016-12-25 VITALS — BP 124/82 | HR 101 | Temp 98.2°F | Resp 15 | Ht 71.0 in | Wt 244.2 lb

## 2016-12-25 DIAGNOSIS — Z6833 Body mass index (BMI) 33.0-33.9, adult: Secondary | ICD-10-CM | POA: Diagnosis not present

## 2016-12-25 DIAGNOSIS — Z9989 Dependence on other enabling machines and devices: Secondary | ICD-10-CM

## 2016-12-25 DIAGNOSIS — E1165 Type 2 diabetes mellitus with hyperglycemia: Secondary | ICD-10-CM

## 2016-12-25 DIAGNOSIS — E1121 Type 2 diabetes mellitus with diabetic nephropathy: Secondary | ICD-10-CM

## 2016-12-25 DIAGNOSIS — G4733 Obstructive sleep apnea (adult) (pediatric): Secondary | ICD-10-CM | POA: Diagnosis not present

## 2016-12-25 DIAGNOSIS — Z794 Long term (current) use of insulin: Secondary | ICD-10-CM | POA: Diagnosis not present

## 2016-12-25 DIAGNOSIS — Z23 Encounter for immunization: Secondary | ICD-10-CM | POA: Diagnosis not present

## 2016-12-25 DIAGNOSIS — E6609 Other obesity due to excess calories: Secondary | ICD-10-CM | POA: Diagnosis not present

## 2016-12-25 DIAGNOSIS — IMO0002 Reserved for concepts with insufficient information to code with codable children: Secondary | ICD-10-CM

## 2016-12-25 LAB — COMPREHENSIVE METABOLIC PANEL
ALT: 52 U/L (ref 0–53)
AST: 31 U/L (ref 0–37)
Albumin: 4.5 g/dL (ref 3.5–5.2)
Alkaline Phosphatase: 57 U/L (ref 39–117)
BUN: 13 mg/dL (ref 6–23)
CO2: 25 mEq/L (ref 19–32)
Calcium: 9.5 mg/dL (ref 8.4–10.5)
Chloride: 103 mEq/L (ref 96–112)
Creatinine, Ser: 1.06 mg/dL (ref 0.40–1.50)
GFR: 77.8 mL/min (ref >60.00)
Glucose, Bld: 144 mg/dL — ABNORMAL HIGH (ref 70–99)
Potassium: 4.1 mEq/L (ref 3.5–5.1)
Sodium: 137 mEq/L (ref 135–145)
Total Bilirubin: 0.5 mg/dL (ref 0.2–1.2)
Total Protein: 7.3 g/dL (ref 6.0–8.3)

## 2016-12-25 LAB — POCT GLYCOSYLATED HEMOGLOBIN (HGB A1C): Hemoglobin A1C: 8.2

## 2016-12-25 MED ORDER — EMPAGLIFLOZIN 25 MG PO TABS: 25.0000 mg | ORAL_TABLET | Freq: Every day | ORAL | 11 refills | Status: DC

## 2016-12-25 NOTE — Patient Instructions (Signed)
Your control of diabetes is improving,  But our goal to prevent long term complications of diabetes (blindness,  Renal failure)  Is an  a1c of  7.0 or lower  Increase the levemir by 3 units every 3 days until your fasting levels are < 130. Exercise for 30 minutes daily  Return in 3 months    Diabetes Mellitus and Exercise Exercising regularly is important for your overall health, especially when you have diabetes (diabetes mellitus). Exercising is not only about losing weight. It has many health benefits, such as increasing muscle strength and bone density and reducing body fat and stress. This leads to improved fitness, flexibility, and endurance, all of which result in better overall health. Exercise has additional benefits for people with diabetes, including:  Reducing appetite.  Helping to lower and control blood glucose.  Lowering blood pressure.  Helping to control amounts of fatty substances (lipids) in the blood, such as cholesterol and triglycerides.  Helping the body to respond better to insulin (improving insulin sensitivity).  Reducing how much insulin the body needs.  Decreasing the risk for heart disease by: ? Lowering cholesterol and triglyceride levels. ? Increasing the levels of good cholesterol. ? Lowering blood glucose levels.  What is my activity plan? Your health care provider or certified diabetes educator can help you make a plan for the type and frequency of exercise (activity plan) that works for you. Make sure that you:  Do at least 150 minutes of moderate-intensity or vigorous-intensity exercise each week. This could be brisk walking, biking, or water aerobics. ? Do stretching and strength exercises, such as yoga or weightlifting, at least 2 times a week. ? Spread out your activity over at least 3 days of the week.  Get some form of physical activity every day. ? Do not go more than 2 days in a row without some kind of physical activity. ? Avoid being  inactive for more than 90 minutes at a time. Take frequent breaks to walk or stretch.  Choose a type of exercise or activity that you enjoy, and set realistic goals.  Start slowly, and gradually increase the intensity of your exercise over time.  What do I need to know about managing my diabetes?  Check your blood glucose before and after exercising. ? If your blood glucose is higher than 240 mg/dL (16.113.3 mmol/L) before you exercise, check your urine for ketones. If you have ketones in your urine, do not exercise until your blood glucose returns to normal.  Know the symptoms of low blood glucose (hypoglycemia) and how to treat it. Your risk for hypoglycemia increases during and after exercise. Common symptoms of hypoglycemia can include: ? Hunger. ? Anxiety. ? Sweating and feeling clammy. ? Confusion. ? Dizziness or feeling light-headed. ? Increased heart rate or palpitations. ? Blurry vision. ? Tingling or numbness around the mouth, lips, or tongue. ? Tremors or shakes. ? Irritability.  Keep a rapid-acting carbohydrate snack available before, during, and after exercise to help prevent or treat hypoglycemia.  Avoid injecting insulin into areas of the body that are going to be exercised. For example, avoid injecting insulin into: ? The arms, when playing tennis. ? The legs, when jogging.  Keep records of your exercise habits. Doing this can help you and your health care provider adjust your diabetes management plan as needed. Write down: ? Food that you eat before and after you exercise. ? Blood glucose levels before and after you exercise. ? The type and amount of  exercise you have done. ? When your insulin is expected to peak, if you use insulin. Avoid exercising at times when your insulin is peaking.  When you start a new exercise or activity, work with your health care provider to make sure the activity is safe for you, and to adjust your insulin, medicines, or food intake as  needed.  Drink plenty of water while you exercise to prevent dehydration or heat stroke. Drink enough fluid to keep your urine clear or pale yellow. This information is not intended to replace advice given to you by your health care provider. Make sure you discuss any questions you have with your health care provider. Document Released: 04/20/2003 Document Revised: 08/18/2015 Document Reviewed: 07/10/2015 Elsevier Interactive Patient Education  2018 [employer]

## 2016-12-25 NOTE — Progress Notes (Signed)
Subjective:  Patient ID: Kenneth [name], male    DOB: 01/20/1966  Age: 51 y.o. MRN: [id number]  CC: The primary encounter diagnosis was Uncontrolled type 2 diabetes mellitus with diabetic nephropathy, with long-term current use of insulin ([place]). Diagnoses of Need for immunization against influenza, Class 1 obesity due to excess calories with serious comorbidity and body mass index (BMI) of 33.0 to 33.9 in adult, and OSA on CPAP were also pertinent to this visit.  HPI Kenneth [name] presents for follow up on uncontrolled diabetes mellitus,  Last seen in August, and medication changes were made along with recommendations for dietary and lifestyle changes   Lab Results  Component Value Date   HGBA1C 8.2 12/25/2016    He is currently taking 35 to 40 units  Of levemir daily at around 9:30 pm   Eating 5 to 6 times  Daily .  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  Taking and tolerating Jardiance       Using flexeril for headache  Outpatient Medications Prior to Visit  Medication Sig Dispense Refill  . amitriptyline (ELAVIL) 10 MG tablet Take 1 tablet (10 mg total) by mouth at bedtime. 90 tablet 0  . blood glucose meter kit and supplies KIT Dispense based on patient and insurance preference. Use up to four times daily as directed. (FOR ICD-9 250.00, 250.01). 1 each 0  . CONTOUR NEXT TEST test strip USE UP TO 4 TIMES A DAY TO CHECK BLOOD SUGARS 100 each 0  . [facility] ULTRA THIN LANCETS MISC TEST UP TO 4 TIMES A DAY 100 each 0  . cyanocobalamin (,VITAMIN B-12,) 1000 MCG/ML injection Inject 1 mL (1,000 mcg total) into the muscle once a week. For 3 weeks,  Then monthly thereafter 10 mL 3  . cyclobenzaprine (FLEXERIL) 10 MG tablet TAKE 1 TABLET BY MOUTH THREE TIMES A DAY AS NEEDED FOR MUSCLE SPASMS 90 tablet 1  . insulin NPH Human (NOVOLIN N RELION) 100 UNIT/ML injection Inject 0.25 mLs (25  Units total) into the skin at bedtime. 10 mL 11  . INSULIN SYRINGE 1CC/29G (RELION INSULIN SYRINGE) 29G X 1/2" 1 ML MISC Use as directed 100 each 0  . lisinopril (PRINIVIL,ZESTRIL) 10 MG tablet TAKE 1 TABLET (10 MG TOTAL) BY MOUTH DAILY. 90 tablet 2  . sertraline (ZOLOFT) 100 MG tablet TAKE 1 TABLET (100 MG TOTAL) BY MOUTH DAILY. 90 tablet 1  . simvastatin (ZOCOR) 20 MG tablet Take 1 tablet (20 mg total) by mouth daily. 30 tablet 5  . sitaGLIPtin-metformin (JANUMET) 50-1000 MG tablet Take 1 tablet by mouth 2 (two) times daily with a meal. 60 tablet 11  . Syringe/Needle, Disp, (SYRINGE 3CC/25GX1") 25G X 1" 3 ML MISC Use for b12 injections 50 each 0  . JARDIANCE 25 MG TABS tablet TAKE 1 TABLET BY MOUTH EVERY DAY 30 tablet 2   No facility-administered medications prior to visit.     Review of Systems;  Patient denies, fevers, malaise, unintentional weight loss, skin rash, eye pain, sinus congestion and sinus pain, sore throat, dysphagia,  hemoptysis , cough, dyspnea, wheezing, chest pain, palpitations, orthopnea, edema, abdominal pain, nausea, melena, diarrhea, constipation, flank pain, dysuria, hematuria, urinary  Frequency, nocturia, numbness, tingling, seizures,  Focal weakness, Loss of consciousness,  Tremor, insomnia, depression, anxiety, and suicidal ideation.      Objective:  BP 124/82 (BP Location: Left Arm,  Patient Position: Sitting, Cuff Size: Normal)   Pulse (!) 101   Temp 98.2 F (36.8 C) (Oral)   Resp 15   Ht 5' 11"  (1.803 m)   Wt 244 lb 3.2 oz (110.8 kg)   SpO2 95%   BMI 34.06 kg/m   BP Readings from Last 3 Encounters:  12/25/16 124/82  09/19/16 130/74  06/13/16 118/80    Wt Readings from Last 3 Encounters:  12/25/16 244 lb 3.2 oz (110.8 kg)  09/19/16 237 lb (107.5 kg)  06/13/16 231 lb 6.4 oz (105 kg)    General appearance: alert, cooperative and appears stated age Ears: normal TM's and external ear canals both ears Throat: lips, mucosa, and tongue normal; teeth  and gums normal Neck: no adenopathy, no carotid bruit, supple, symmetrical, trachea midline and thyroid not enlarged, symmetric, no tenderness/mass/nodules Back: symmetric, no curvature. ROM normal. No CVA tenderness. Lungs: clear to auscultation bilaterally Heart: regular rate and rhythm, S1, S2 normal, no murmur, click, rub or gallop Abdomen: soft, non-tender; bowel sounds normal; no masses,  no organomegaly Pulses: 2+ and symmetric Skin: Skin color, texture, turgor normal. No rashes or lesions Lymph nodes: Cervical, supraclavicular, and axillary nodes normal.  Lab Results  Component Value Date   HGBA1C 8.2 12/25/2016   HGBA1C 9.0 (H) 09/19/2016   HGBA1C 8.5 06/13/2016    Lab Results  Component Value Date   CREATININE 1.06 12/25/2016   CREATININE 1.16 09/19/2016   CREATININE 1.14 06/13/2016    Lab Results  Component Value Date   WBC 8.7 03/11/2013   HGB 15.5 03/11/2013   HCT 46.8 03/11/2013   PLT 216.0 03/11/2013   GLUCOSE 144 (H) 12/25/2016   CHOL 137 09/19/2016   TRIG 152.0 (H) 09/19/2016   HDL 45.60 09/19/2016   LDLDIRECT 0.0 03/15/2016   LDLCALC 61 09/19/2016   ALT 52 12/25/2016   AST 31 12/25/2016   NA 137 12/25/2016   K 4.1 12/25/2016   CL 103 12/25/2016   CREATININE 1.06 12/25/2016   BUN 13 12/25/2016   CO2 25 12/25/2016   TSH 1.16 03/11/2013   PSA 1.21 09/19/2016   HGBA1C 8.2 12/25/2016   MICROALBUR 3.3 (H) 03/15/2016    No results found.  Assessment & Plan:   Problem List Items Addressed This Visit    Obesity    I have addressed  BMI and recommended a low glycemic index diet utilizing smaller more frequent meals to increase metabolism.  I have also recommended that patient start exercising with a goal of 30 minutes of aerobic exercise a minimum of 5 days per week.        Relevant Medications   empagliflozin (JARDIANCE) 25 MG TABS tablet   OSA on CPAP    Diagnosed by sleep study. He states that he is wearing  CPAP every night a minimum of 6  hours per night and notes improved daytime wakefulness and decreased fatigue       Uncontrolled type 2 diabetes mellitus with diabetic nephropathy, with long-term current use of insulin ([facility]) - Primary    Improving but not at goal .  Continue jardiance,  Increase Lantus every 3 days by 3 units until fasting sugars are < 130.  RTC 3 months.  Daily  exercise encouraged .  Lab Results  Component Value Date   HGBA1C 8.2 12/25/2016   Lab Results  Component Value Date   MICROALBUR 3.3 (H) 03/15/2016   Lab Results  Component Value Date   CREATININE 1.06 12/25/2016  Relevant Medications   empagliflozin (JARDIANCE) 25 MG TABS tablet   Other Relevant Orders   POCT HgB A1C (Completed)   Comprehensive metabolic panel (Completed)    Other Visit Diagnoses    Need for immunization against influenza       Relevant Orders   Flu Vaccine QUAD 36+ mos IM (Completed)     A total of 25 minutes of face to face time was spent with patient more than half of which was spent in counselling about the above mentioned conditions  and coordination of care  I have changed Shanon Brow Hartig's JARDIANCE to empagliflozin. I am also having him maintain his INSULIN SYRINGE 1CC/29G, sitaGLIPtin-metformin, insulin NPH Human, simvastatin, lisinopril, amitriptyline, blood glucose meter kit and supplies, cyanocobalamin, SYRINGE 3CC/25GX1", [facility] ULTRA THIN LANCETS, cyclobenzaprine, sertraline, and CONTOUR NEXT TEST.  Meds ordered this encounter  Medications  . empagliflozin (JARDIANCE) 25 MG TABS tablet    Sig: Take 25 mg daily by mouth.    Dispense:  30 tablet    Refill:  11    Medications Discontinued During This Encounter  Medication Reason  . JARDIANCE 25 MG TABS tablet     Follow-up: Return in about 3 months (around 03/27/2017) for follow up diabetes.   Crecencio Mc, MD

## 2016-12-28 NOTE — Assessment & Plan Note (Addendum)
Improving but not at goal .  Continue jardiance,  Increase Lantus every 3 days by 3 units until fasting sugars are < 130.  RTC 3 months.  Daily  exercise encouraged .  Lab Results  Component Value Date   HGBA1C 8.2 12/25/2016   Lab Results  Component Value Date   MICROALBUR 3.3 (H) 03/15/2016   Lab Results  Component Value Date   CREATININE 1.06 12/25/2016

## 2016-12-28 NOTE — Assessment & Plan Note (Signed)
Diagnosed by sleep study. He states that he is wearing  CPAP every night a minimum of 6 hours per night and notes improved daytime wakefulness and decreased fatigue

## 2016-12-28 NOTE — Assessment & Plan Note (Signed)
I have addressed  BMI and recommended a low glycemic index diet utilizing smaller more frequent meals to increase metabolism.  I have also recommended that patient start exercising with a goal of 30 minutes of aerobic exercise a minimum of 5 days per week.  

## 2017-01-25 ENCOUNTER — Other Ambulatory Visit: Payer: Self-pay | Admitting: Internal Medicine

## 2017-02-17 LAB — HM DIABETES EYE EXAM

## 2017-02-20 ENCOUNTER — Other Ambulatory Visit: Payer: Self-pay | Admitting: Internal Medicine

## 2017-03-25 ENCOUNTER — Encounter: Payer: Self-pay | Admitting: Internal Medicine

## 2017-03-27 ENCOUNTER — Ambulatory Visit: Payer: BLUE CROSS/BLUE SHIELD | Admitting: Internal Medicine

## 2017-03-27 ENCOUNTER — Encounter: Payer: Self-pay | Admitting: Internal Medicine

## 2017-03-27 VITALS — BP 130/88 | HR 87 | Temp 98.1°F | Resp 15 | Ht 71.0 in | Wt 245.6 lb

## 2017-03-27 DIAGNOSIS — E538 Deficiency of other specified B group vitamins: Secondary | ICD-10-CM | POA: Diagnosis not present

## 2017-03-27 DIAGNOSIS — Z794 Long term (current) use of insulin: Secondary | ICD-10-CM | POA: Diagnosis not present

## 2017-03-27 DIAGNOSIS — E1121 Type 2 diabetes mellitus with diabetic nephropathy: Secondary | ICD-10-CM

## 2017-03-27 DIAGNOSIS — IMO0002 Reserved for concepts with insufficient information to code with codable children: Secondary | ICD-10-CM

## 2017-03-27 DIAGNOSIS — F320 Major depressive disorder, single episode, mild: Secondary | ICD-10-CM | POA: Diagnosis not present

## 2017-03-27 DIAGNOSIS — E1165 Type 2 diabetes mellitus with hyperglycemia: Secondary | ICD-10-CM

## 2017-03-27 DIAGNOSIS — IMO0001 Reserved for inherently not codable concepts without codable children: Secondary | ICD-10-CM

## 2017-03-27 LAB — COMPREHENSIVE METABOLIC PANEL
ALT: 61 U/L — ABNORMAL HIGH (ref 0–53)
AST: 33 U/L (ref 0–37)
Albumin: 4.6 g/dL (ref 3.5–5.2)
Alkaline Phosphatase: 68 U/L (ref 39–117)
BUN: 16 mg/dL (ref 6–23)
CO2: 24 mEq/L (ref 19–32)
Calcium: 9.7 mg/dL (ref 8.4–10.5)
Chloride: 102 mEq/L (ref 96–112)
Creatinine, Ser: 1.14 mg/dL (ref 0.40–1.50)
GFR: 71.46 mL/min (ref >60.00)
Glucose, Bld: 182 mg/dL — ABNORMAL HIGH (ref 70–99)
Potassium: 4.4 mEq/L (ref 3.5–5.1)
Sodium: 138 mEq/L (ref 135–145)
Total Bilirubin: 0.7 mg/dL (ref 0.2–1.2)
Total Protein: 7.4 g/dL (ref 6.0–8.3)

## 2017-03-27 LAB — LIPID PANEL
Cholesterol: 144 mg/dL (ref 0–200)
HDL: 44.9 mg/dL (ref >39.00)
LDL Cholesterol: 81 mg/dL (ref 0–99)
NonHDL: 99.27
Total CHOL/HDL Ratio: 3
Triglycerides: 92 mg/dL (ref 0.0–149.0)
VLDL: 18.4 mg/dL (ref 0.0–40.0)

## 2017-03-27 LAB — POCT GLYCOSYLATED HEMOGLOBIN (HGB A1C): Hemoglobin A1C: 8.5

## 2017-03-27 LAB — VITAMIN B12: Vitamin B-12: 256 pg/mL (ref 211–911)

## 2017-03-27 LAB — MICROALBUMIN / CREATININE URINE RATIO
Creatinine,U: 83.5 mg/dL
Microalb Creat Ratio: 3.7 mg/g (ref 0.0–30.0)
Microalb, Ur: 3.1 mg/dL — ABNORMAL HIGH (ref 0.0–1.9)

## 2017-03-27 MED ORDER — BUPROPION HCL ER (XL) 150 MG PO TB24: 150.0000 mg | ORAL_TABLET | Freq: Every day | ORAL | 0 refills | Status: DC

## 2017-03-27 MED ORDER — "INSULIN SYRINGE 29G X 1/2"" 1 ML MISC": 0 refills | Status: DC

## 2017-03-27 MED ORDER — INSULIN NPH (HUMAN) (ISOPHANE) 100 UNIT/ML ~~LOC~~ SUSP: 48.0000 [IU] | Freq: Two times a day (BID) | SUBCUTANEOUS | 3 refills | Status: DC

## 2017-03-27 MED ORDER — CYCLOBENZAPRINE HCL 10 MG PO TABS: ORAL_TABLET | ORAL | 1 refills | Status: AC

## 2017-03-27 NOTE — Progress Notes (Signed)
poct

## 2017-03-27 NOTE — Telephone Encounter (Signed)
rx was sent in during pt's office visit with Dr. Darrick Huntsmanullo today.

## 2017-03-27 NOTE — Progress Notes (Signed)
Subjective:  Patient ID: Kenneth [name], male    DOB: 17-Jun-1965  Age: 52 y.o. MRN: [id number]  CC: The primary encounter diagnosis was B12 deficiency. Diagnoses of Uncontrolled type 2 diabetes mellitus without complication, with long-term current use of insulin ([place]), Uncontrolled type 2 diabetes mellitus with diabetic nephropathy, with long-term current use of insulin ([place]), and Depression, major, single episode, mild ([employer]) were also pertinent to this visit.  HPI [name] presents for 3 month follow up on uncontrolled diabetes.  Patient has no new complaints today.  Patient is following a low glycemic index diet about half of the time  and taking all prescribed medications regularly without side effects.  Fasting sugars have been under less than 140 most of the time and post prandials have been elevated but under 200 except on rare occasions.   Patient has had an eye exam in the last 12 months and checks feet regularly for signs of infection.  Patient does not walk barefoot outside,  And denies an numbness tingling or burning in feet. Patient is up to date on all recommended vaccinations.  Med review:   He is currently taking 45 to 48 units  Of NPH  daily at around 9:30 pm but ran out last week .  Eating 5 to 6 times  Daily .  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    Taking and tolerating Jardiance          Stopped taking B12 injections and is taking oral supplements daily  through an MVI with B12  Wearing CPAP every night .  8 hours per night      Lab Results  Component Value Date   HGBA1C 8.5 03/27/2017   Lab Results  Component Value Date   VITAMINB12 256 03/27/2017     Outpatient Medications Prior to Visit  Medication Sig Dispense Refill  . blood glucose meter kit and supplies KIT Dispense based on patient and insurance preference. Use up to four times  daily as directed. (FOR ICD-9 250.00, 250.01). 1 each 0  . CONTOUR NEXT TEST test strip USE UP TO 4 TIMES A DAY TO CHECK BLOOD SUGARS 100 each 0  . [facility] ULTRA THIN LANCETS MISC TEST UP TO 4 TIMES A DAY 100 each 0  . empagliflozin (JARDIANCE) 25 MG TABS tablet Take 25 mg daily by mouth. 30 tablet 11  . lisinopril (PRINIVIL,ZESTRIL) 10 MG tablet TAKE 1 TABLET (10 MG TOTAL) BY MOUTH DAILY. 90 tablet 2  . sertraline (ZOLOFT) 100 MG tablet TAKE 1 TABLET (100 MG TOTAL) BY MOUTH DAILY. 90 tablet 1  . simvastatin (ZOCOR) 20 MG tablet TAKE 1 TABLET BY MOUTH EVERY DAY 30 tablet 5  . sitaGLIPtin-metformin (JANUMET) 50-1000 MG tablet Take 1 tablet by mouth 2 (two) times daily with a meal. 60 tablet 11  . cyclobenzaprine (FLEXERIL) 10 MG tablet TAKE 1 TABLET BY MOUTH THREE TIMES A DAY AS NEEDED FOR MUSCLE SPASMS 90 tablet 0  . insulin NPH Human (NOVOLIN N RELION) 100 UNIT/ML injection Inject 0.25 mLs (25 Units total) into the skin at bedtime. 10 mL 11  . INSULIN SYRINGE 1CC/29G (RELION INSULIN SYRINGE) 29G X 1/2" 1 ML MISC Use as directed 100 each 0  . amitriptyline (ELAVIL) 10 MG tablet Take 1 tablet (10 mg total) by mouth at bedtime. (Patient not taking: Reported on 03/27/2017) 90 tablet 0  .  cyanocobalamin (,VITAMIN B-12,) 1000 MCG/ML injection Inject 1 mL (1,000 mcg total) into the muscle once a week. For 3 weeks,  Then monthly thereafter (Patient not taking: Reported on 03/27/2017) 10 mL 3  . Syringe/Needle, Disp, (SYRINGE 3CC/25GX1") 25G X 1" 3 ML MISC Use for b12 injections (Patient not taking: Reported on 03/27/2017) 50 each 0   No facility-administered medications prior to visit.     Review of Systems;  Patient denies headache, fevers, malaise, unintentional weight loss, skin rash, eye pain, sinus congestion and sinus pain, sore throat, dysphagia,  hemoptysis , cough, dyspnea, wheezing, chest pain, palpitations, orthopnea, edema, abdominal pain, nausea, melena, diarrhea, constipation, flank pain,  dysuria, hematuria, urinary  Frequency, nocturia, numbness, tingling, seizures,  Focal weakness, Loss of consciousness,  Tremor, insomnia, depression, anxiety, and suicidal ideation.      Objective:  BP 130/88 (BP Location: Left Arm, Patient Position: Sitting, Cuff Size: Normal)   Pulse 87   Temp 98.1 F (36.7 C) (Oral)   Resp 15   Ht 5' 11"  (1.803 m)   Wt 245 lb 9.6 oz (111.4 kg)   SpO2 94%   BMI 34.25 kg/m   BP Readings from Last 3 Encounters:  03/27/17 130/88  12/25/16 124/82  09/19/16 130/74    Wt Readings from Last 3 Encounters:  03/27/17 245 lb 9.6 oz (111.4 kg)  12/25/16 244 lb 3.2 oz (110.8 kg)  09/19/16 237 lb (107.5 kg)    General appearance: alert, cooperative and appears stated age Ears: normal TM's and external ear canals both ears Throat: lips, mucosa, and tongue normal; teeth and gums normal Neck: no adenopathy, no carotid bruit, supple, symmetrical, trachea midline and thyroid not enlarged, symmetric, no tenderness/mass/nodules Back: symmetric, no curvature. ROM normal. No CVA tenderness. Lungs: clear to auscultation bilaterally Heart: regular rate and rhythm, S1, S2 normal, no murmur, click, rub or gallop Abdomen: soft, non-tender; bowel sounds normal; no masses,  no organomegaly Pulses: 2+ and symmetric Skin: Skin color, texture, turgor normal. No rashes or lesions Lymph nodes: Cervical, supraclavicular, and axillary nodes normal.  Lab Results  Component Value Date   HGBA1C 8.5 03/27/2017   HGBA1C 8.2 12/25/2016   HGBA1C 9.0 (H) 09/19/2016    Lab Results  Component Value Date   CREATININE 1.14 03/27/2017   CREATININE 1.06 12/25/2016   CREATININE 1.16 09/19/2016    Lab Results  Component Value Date   WBC 8.7 03/11/2013   HGB 15.5 03/11/2013   HCT 46.8 03/11/2013   PLT 216.0 03/11/2013   GLUCOSE 182 (H) 03/27/2017   CHOL 144 03/27/2017   TRIG 92.0 03/27/2017   HDL 44.90 03/27/2017   LDLDIRECT 0.0 03/15/2016   LDLCALC 81 03/27/2017    ALT 61 (H) 03/27/2017   AST 33 03/27/2017   NA 138 03/27/2017   K 4.4 03/27/2017   CL 102 03/27/2017   CREATININE 1.14 03/27/2017   BUN 16 03/27/2017   CO2 24 03/27/2017   TSH 1.16 03/11/2013   PSA 1.21 09/19/2016   HGBA1C 8.5 03/27/2017   MICROALBUR 3.1 (H) 03/27/2017    No results found.  Assessment & Plan:   Problem List Items Addressed This Visit    Uncontrolled type 2 diabetes mellitus with diabetic nephropathy, with long-term current use of insulin ([place])    Still uncontrolled . Continue NPH  48 units in the am. And add 10 units in the AM . (levemir was too $$$), continue jardiace.  Diet discussed.    Lab Results  Component Value Date  HGBA1C 8.5 03/27/2017   Lab Results  Component Value Date   MICROALBUR 3.1 (H) 03/27/2017         Relevant Medications   insulin NPH Human (NOVOLIN N RELION) 100 UNIT/ML injection   B12 deficiency - Primary    recommedn resuming injections given drop to < 300 on oral meds  Lab Results  Component Value Date   VITAMINB12 256 03/27/2017         Relevant Orders   Vitamin B12 (Completed)   Depression, major, single episode, mild ([facility])    Positive screen despite useo of sertraline .  ADDING Trial of wellbutrin      Relevant Medications   buPROPion (WELLBUTRIN XL) 150 MG 24 hr tablet    Other Visit Diagnoses    Uncontrolled type 2 diabetes mellitus without complication, with long-term current use of insulin ([facility])       Relevant Medications   insulin NPH Human (NOVOLIN N RELION) 100 UNIT/ML injection   Other Relevant Orders   POCT HgB A1C (Completed)   Microalbumin / creatinine urine ratio (Completed)   Comprehensive metabolic panel (Completed)   Lipid panel (Completed)      I have discontinued Shanon Brow Krasinski's amitriptyline, cyanocobalamin, and SYRINGE 3CC/25GX1". I have also changed his insulin NPH Human. Additionally, I am having him start on buPROPion. Lastly, I am having him maintain his sitaGLIPtin-metformin,  lisinopril, blood glucose meter kit and supplies, [facility] ULTRA THIN LANCETS, sertraline, CONTOUR NEXT TEST, empagliflozin, simvastatin, INSULIN SYRINGE 1CC/29G, and cyclobenzaprine.  Meds ordered this encounter  Medications  . INSULIN SYRINGE 1CC/29G (RELION INSULIN SYRINGE) 29G X 1/2" 1 ML MISC    Sig: Use as directed    Dispense:  100 each    Refill:  0  . cyclobenzaprine (FLEXERIL) 10 MG tablet    Sig: TAKE 1 TABLET BY MOUTH THREE TIMES A DAY AS NEEDED FOR MUSCLE SPASMS    Dispense:  90 tablet    Refill:  1    KEEP ON FILE FOR FUTURE REFILLS  . insulin NPH Human (NOVOLIN N RELION) 100 UNIT/ML injection    Sig: Inject 0.48 mLs (48 Units total) into the skin 2 (two) times daily before a meal.    Dispense:  30 mL    Refill:  3  . buPROPion (WELLBUTRIN XL) 150 MG 24 hr tablet    Sig: Take 1 tablet (150 mg total) by mouth daily.    Dispense:  90 tablet    Refill:  0    Medications Discontinued During This Encounter  Medication Reason  . amitriptyline (ELAVIL) 10 MG tablet Patient has not taken in last 30 days  . cyanocobalamin (,VITAMIN B-12,) 1000 MCG/ML injection Completed Course  . Syringe/Needle, Disp, (SYRINGE 3CC/25GX1") 25G X 1" 3 ML MISC Patient has not taken in last 30 days  . INSULIN SYRINGE 1CC/29G (RELION INSULIN SYRINGE) 29G X 1/2" 1 ML MISC Reorder  . cyclobenzaprine (FLEXERIL) 10 MG tablet Reorder  . insulin NPH Human (NOVOLIN N RELION) 100 UNIT/ML injection     Follow-up: Return in about 3 months (around 06/24/2017) for follow up diabetes.   Crecencio Mc, MD

## 2017-03-27 NOTE — Patient Instructions (Signed)
CONTINUE 48 UNIT NPH BEFORE DINNER AROUND   ADD 10 UNITS IN THE MORNING AT BREAKFAST  CHECK SUGARS BEFORE DINNER FOR OE WEEK BEFORE MAKING ANY CHNAGES   ADD WELLBUTRIN FOR MOOD IN THE MORNING,  CAN INCREASE DOSE  TO 2 TABLETS AFTER TWO WEEKS IF TOLERATING   AND NO DIFFERENCE IN ENERGY   OR MOOD , AND LET ME KNOW

## 2017-03-28 ENCOUNTER — Other Ambulatory Visit: Payer: Self-pay | Admitting: Internal Medicine

## 2017-03-28 ENCOUNTER — Encounter: Payer: Self-pay | Admitting: Internal Medicine

## 2017-03-28 MED ORDER — CYANOCOBALAMIN 1000 MCG/ML IJ SOLN: 1000.0000 ug | INTRAMUSCULAR | 0 refills | Status: DC

## 2017-03-28 MED ORDER — "SYRINGE 25G X 1"" 3 ML MISC": 0 refills | Status: DC

## 2017-03-28 NOTE — Progress Notes (Signed)
o

## 2017-03-29 DIAGNOSIS — F320 Major depressive disorder, single episode, mild: Secondary | ICD-10-CM | POA: Insufficient documentation

## 2017-03-29 NOTE — Assessment & Plan Note (Addendum)
Positive screen despite useo of sertraline .  ADDING Trial of wellbutrin

## 2017-03-29 NOTE — Assessment & Plan Note (Signed)
recommedn resuming injections given drop to < 300 on oral meds  Lab Results  Component Value Date   VITAMINB12 256 03/27/2017

## 2017-03-29 NOTE — Assessment & Plan Note (Addendum)
Still uncontrolled . Continue NPH  48 units in the am. And add 10 units in the AM . (levemir was too $$$), continue jardiace.  Diet discussed.    Lab Results  Component Value Date   HGBA1C 8.5 03/27/2017   Lab Results  Component Value Date   MICROALBUR 3.1 (H) 03/27/2017

## 2017-04-06 ENCOUNTER — Other Ambulatory Visit: Payer: Self-pay | Admitting: Internal Medicine

## 2017-04-18 ENCOUNTER — Telehealth: Payer: Self-pay | Admitting: Internal Medicine

## 2017-04-18 DIAGNOSIS — IMO0001 Reserved for inherently not codable concepts without codable children: Secondary | ICD-10-CM

## 2017-04-18 DIAGNOSIS — E1165 Type 2 diabetes mellitus with hyperglycemia: Principal | ICD-10-CM

## 2017-04-18 MED ORDER — SITAGLIPTIN PHOS-METFORMIN HCL 50-1000 MG PO TABS: 1.0000 | ORAL_TABLET | Freq: Two times a day (BID) | ORAL | 1 refills | Status: DC

## 2017-04-18 NOTE — Telephone Encounter (Signed)
Copied from CRM [id number]. Topic: Inquiry >> Apr 18, 2017 10:13 AM Yvonna Alanisobinson, Andra M wrote: Reason for CRM: Patient called requesting a refill of SitaGLIPtin-metformin (JANUMET) 50-1000 MG tablet. Patient's preferred pharmacy is [id number] - [address] [phone] (Phone) [phone] (Fax).

## 2017-05-01 ENCOUNTER — Encounter: Payer: Self-pay | Admitting: Internal Medicine

## 2017-05-01 DIAGNOSIS — Z7184 Encounter for health counseling related to travel: Secondary | ICD-10-CM

## 2017-05-07 ENCOUNTER — Telehealth: Payer: Self-pay

## 2017-05-07 NOTE — Telephone Encounter (Signed)
LMTCB. Please transfer pt to our office.  

## 2017-05-07 NOTE — Telephone Encounter (Signed)
Copied from CRM [phone]. Topic: Referral - Question >> May 07, 2017  2:38 PM Terisa Starraylor, Brittany L wrote: Reason for CRM: Claris CheMargaret from [facility] with [facility] called and said that Dr Darrick Huntsmanullo put a referral in for him to have travel shots to go to [place]. He does not need a referral just have the patient call to sch. They do not have yellow fever shots. Please advise. They are sending all the patients to [facility] [phone]. She is closing the referral

## 2017-05-08 NOTE — Telephone Encounter (Signed)
Thank You.

## 2017-05-08 NOTE — Telephone Encounter (Signed)
Infectious disease does not give travel shots and they recommend [facility] to get the shots needed for travel. Pt has been notified of this and was given the number so he could call and schedule an appt.

## 2017-05-23 ENCOUNTER — Other Ambulatory Visit: Payer: Self-pay | Admitting: Internal Medicine

## 2017-05-26 ENCOUNTER — Other Ambulatory Visit: Payer: Self-pay | Admitting: Internal Medicine

## 2017-06-15 ENCOUNTER — Other Ambulatory Visit: Payer: Self-pay | Admitting: Internal Medicine

## 2017-06-15 DIAGNOSIS — IMO0001 Reserved for inherently not codable concepts without codable children: Secondary | ICD-10-CM

## 2017-06-15 DIAGNOSIS — E1165 Type 2 diabetes mellitus with hyperglycemia: Principal | ICD-10-CM

## 2017-06-23 ENCOUNTER — Other Ambulatory Visit: Payer: Self-pay | Admitting: Internal Medicine

## 2017-06-25 ENCOUNTER — Ambulatory Visit: Payer: Self-pay | Admitting: Internal Medicine

## 2017-07-16 ENCOUNTER — Other Ambulatory Visit: Payer: Self-pay | Admitting: Internal Medicine

## 2017-07-18 NOTE — Telephone Encounter (Signed)
Medication has been d/c'd.  Refilled: 09/19/2016 Last OV: 03/27/2017 Next OV: not scheduled

## 2017-07-26 ENCOUNTER — Other Ambulatory Visit: Payer: Self-pay | Admitting: Internal Medicine

## 2017-07-26 DIAGNOSIS — IMO0001 Reserved for inherently not codable concepts without codable children: Secondary | ICD-10-CM

## 2017-07-26 DIAGNOSIS — Z794 Long term (current) use of insulin: Principal | ICD-10-CM

## 2017-07-26 DIAGNOSIS — E1165 Type 2 diabetes mellitus with hyperglycemia: Principal | ICD-10-CM

## 2017-09-18 ENCOUNTER — Other Ambulatory Visit: Payer: Self-pay | Admitting: Internal Medicine

## 2017-09-18 DIAGNOSIS — IMO0001 Reserved for inherently not codable concepts without codable children: Secondary | ICD-10-CM

## 2017-09-18 DIAGNOSIS — Z794 Long term (current) use of insulin: Principal | ICD-10-CM

## 2017-09-18 DIAGNOSIS — E1165 Type 2 diabetes mellitus with hyperglycemia: Principal | ICD-10-CM

## 2017-10-15 ENCOUNTER — Other Ambulatory Visit: Payer: Self-pay | Admitting: Internal Medicine

## 2017-10-20 ENCOUNTER — Other Ambulatory Visit: Payer: Self-pay | Admitting: Internal Medicine

## 2017-10-20 DIAGNOSIS — E1165 Type 2 diabetes mellitus with hyperglycemia: Principal | ICD-10-CM

## 2017-10-20 DIAGNOSIS — Z794 Long term (current) use of insulin: Principal | ICD-10-CM

## 2017-10-20 DIAGNOSIS — IMO0001 Reserved for inherently not codable concepts without codable children: Secondary | ICD-10-CM

## 2017-11-18 ENCOUNTER — Other Ambulatory Visit: Payer: Self-pay | Admitting: Internal Medicine

## 2017-12-07 ENCOUNTER — Other Ambulatory Visit: Payer: Self-pay | Admitting: Internal Medicine

## 2017-12-07 DIAGNOSIS — E1165 Type 2 diabetes mellitus with hyperglycemia: Principal | ICD-10-CM

## 2017-12-07 DIAGNOSIS — IMO0001 Reserved for inherently not codable concepts without codable children: Secondary | ICD-10-CM

## 2017-12-16 ENCOUNTER — Other Ambulatory Visit: Payer: Self-pay | Admitting: Internal Medicine

## 2017-12-16 DIAGNOSIS — Z794 Long term (current) use of insulin: Principal | ICD-10-CM

## 2017-12-16 DIAGNOSIS — E1165 Type 2 diabetes mellitus with hyperglycemia: Principal | ICD-10-CM

## 2017-12-16 DIAGNOSIS — E1121 Type 2 diabetes mellitus with diabetic nephropathy: Secondary | ICD-10-CM

## 2017-12-16 DIAGNOSIS — IMO0002 Reserved for concepts with insufficient information to code with codable children: Secondary | ICD-10-CM

## 2018-01-05 MED ORDER — TRIAMCINOLONE ACETONIDE 0.1 % EX CREA: 1.0000 "application " | TOPICAL_CREAM | Freq: Two times a day (BID) | CUTANEOUS | 0 refills | Status: AC

## 2018-01-21 ENCOUNTER — Other Ambulatory Visit: Payer: Self-pay | Admitting: Internal Medicine

## 2018-01-21 DIAGNOSIS — IMO0001 Reserved for inherently not codable concepts without codable children: Secondary | ICD-10-CM

## 2018-01-21 DIAGNOSIS — E1165 Type 2 diabetes mellitus with hyperglycemia: Principal | ICD-10-CM

## 2018-01-21 DIAGNOSIS — Z794 Long term (current) use of insulin: Principal | ICD-10-CM

## 2018-01-21 NOTE — Telephone Encounter (Signed)
Refilled: 05/27/2017 Last OV: 03/27/2017 Next OV: not scheduled

## 2018-02-09 ENCOUNTER — Other Ambulatory Visit: Payer: Self-pay | Admitting: Internal Medicine

## 2018-02-17 ENCOUNTER — Other Ambulatory Visit: Payer: Self-pay | Admitting: Internal Medicine

## 2018-03-09 ENCOUNTER — Other Ambulatory Visit: Payer: Self-pay | Admitting: Internal Medicine

## 2018-03-09 DIAGNOSIS — IMO0002 Reserved for concepts with insufficient information to code with codable children: Secondary | ICD-10-CM

## 2018-03-09 DIAGNOSIS — E1121 Type 2 diabetes mellitus with diabetic nephropathy: Secondary | ICD-10-CM

## 2018-03-09 DIAGNOSIS — E1165 Type 2 diabetes mellitus with hyperglycemia: Principal | ICD-10-CM

## 2018-03-09 DIAGNOSIS — Z794 Long term (current) use of insulin: Secondary | ICD-10-CM

## 2018-03-09 DIAGNOSIS — IMO0001 Reserved for inherently not codable concepts without codable children: Secondary | ICD-10-CM

## 2018-05-15 ENCOUNTER — Other Ambulatory Visit: Payer: Self-pay | Admitting: Internal Medicine

## 2018-05-24 ENCOUNTER — Other Ambulatory Visit: Payer: Self-pay | Admitting: Internal Medicine

## 2018-05-24 DIAGNOSIS — E1165 Type 2 diabetes mellitus with hyperglycemia: Principal | ICD-10-CM

## 2018-05-24 DIAGNOSIS — Z794 Long term (current) use of insulin: Principal | ICD-10-CM

## 2018-05-24 DIAGNOSIS — IMO0001 Reserved for inherently not codable concepts without codable children: Secondary | ICD-10-CM

## 2018-06-06 ENCOUNTER — Other Ambulatory Visit: Payer: Self-pay | Admitting: Internal Medicine

## 2018-06-20 ENCOUNTER — Other Ambulatory Visit: Payer: Self-pay | Admitting: Internal Medicine

## 2018-06-20 DIAGNOSIS — E1121 Type 2 diabetes mellitus with diabetic nephropathy: Secondary | ICD-10-CM

## 2018-06-20 DIAGNOSIS — IMO0002 Reserved for concepts with insufficient information to code with codable children: Secondary | ICD-10-CM

## 2018-06-20 DIAGNOSIS — IMO0001 Reserved for inherently not codable concepts without codable children: Secondary | ICD-10-CM

## 2018-06-22 NOTE — Telephone Encounter (Signed)
Left message for patient to return call to office needs appointment and labs.

## 2018-07-01 ENCOUNTER — Other Ambulatory Visit: Payer: Self-pay | Admitting: Internal Medicine

## 2018-08-13 ENCOUNTER — Other Ambulatory Visit: Payer: Self-pay | Admitting: Internal Medicine

## 2018-08-13 ENCOUNTER — Telehealth: Payer: Self-pay

## 2018-08-13 NOTE — Telephone Encounter (Signed)
Copied from [place] [id number]. Topic: General - Other >> Aug 13, 2018  1:08 PM [name] wrote: Reason for CRM: Patient called to request a change of his PCP.  Patient would like to become a patient at [facility].  Please advise and call patient back at [phone]

## 2018-08-13 NOTE — Telephone Encounter (Signed)
He does not need permission or a call back from me  to change his PCP.  Thank him for notifying us  . Please let him know that  I cannot refill his medications since he has not been seen or had labs in over a year (FEb 2019)

## 2018-08-17 NOTE — Telephone Encounter (Signed)
Called patient.  No answer.  LMTCB. 

## 2018-08-19 NOTE — Telephone Encounter (Signed)
Called patient again.  LMTCB.  Sent patient a Therapist, music.

## 2018-08-20 ENCOUNTER — Other Ambulatory Visit: Payer: Self-pay | Admitting: Internal Medicine

## 2018-08-20 DIAGNOSIS — IMO0001 Reserved for inherently not codable concepts without codable children: Secondary | ICD-10-CM

## 2018-09-18 ENCOUNTER — Other Ambulatory Visit: Payer: Self-pay | Admitting: Internal Medicine

## 2018-09-18 DIAGNOSIS — IMO0002 Reserved for concepts with insufficient information to code with codable children: Secondary | ICD-10-CM

## 2018-09-18 DIAGNOSIS — E1121 Type 2 diabetes mellitus with diabetic nephropathy: Secondary | ICD-10-CM

## 2018-09-18 DIAGNOSIS — IMO0001 Reserved for inherently not codable concepts without codable children: Secondary | ICD-10-CM

## 2019-11-01 ENCOUNTER — Other Ambulatory Visit: Payer: Self-pay | Admitting: Internal Medicine

## 2020-05-04 ENCOUNTER — Other Ambulatory Visit: Payer: Self-pay | Admitting: Internal Medicine

## 2020-05-12 ENCOUNTER — Other Ambulatory Visit: Payer: Self-pay | Admitting: Internal Medicine
